# Patient Record
Sex: Female | Born: 1955 | Race: White | Hispanic: No | Smoking: Current some day smoker
Health system: Southern US, Community
[De-identification: ages and names within clinical notes are randomized; demographics above are authoritative.]

## PROBLEM LIST (undated history)

## (undated) DIAGNOSIS — G43909 Migraine, unspecified, not intractable, without status migrainosus: Secondary | ICD-10-CM

## (undated) DIAGNOSIS — Z9889 Other specified postprocedural states: Secondary | ICD-10-CM

## (undated) DIAGNOSIS — R112 Nausea with vomiting, unspecified: Secondary | ICD-10-CM

## (undated) DIAGNOSIS — G479 Sleep disorder, unspecified: Secondary | ICD-10-CM

## (undated) DIAGNOSIS — K589 Irritable bowel syndrome without diarrhea: Secondary | ICD-10-CM

## (undated) DIAGNOSIS — A044 Other intestinal Escherichia coli infections: Secondary | ICD-10-CM

## (undated) DIAGNOSIS — M1990 Unspecified osteoarthritis, unspecified site: Secondary | ICD-10-CM

## (undated) DIAGNOSIS — K635 Polyp of colon: Secondary | ICD-10-CM

## (undated) DIAGNOSIS — A0811 Acute gastroenteropathy due to Norwalk agent: Secondary | ICD-10-CM

## (undated) HISTORY — DX: Sleep disorder, unspecified: G47.9

## (undated) HISTORY — DX: Irritable bowel syndrome, unspecified: K58.9

## (undated) HISTORY — PX: PARS PLANA VITRECTOMY W/ REPAIR OF MACULAR HOLE: SHX2170

## (undated) HISTORY — DX: Other intestinal Escherichia coli infections: A04.4

## (undated) HISTORY — DX: Acute gastroenteropathy due to Norwalk agent: A08.11

## (undated) HISTORY — DX: Migraine, unspecified, not intractable, without status migrainosus: G43.909

## (undated) HISTORY — PX: SHOULDER SURGERY: SHX246

## (undated) HISTORY — DX: Unspecified osteoarthritis, unspecified site: M19.90

## (undated) HISTORY — DX: Polyp of colon: K63.5

---

## 1995-04-11 HISTORY — PX: CHOLECYSTECTOMY: SHX55

## 2001-01-24 ENCOUNTER — Other Ambulatory Visit: Admission: RE | Admit: 2001-01-24 | Discharge: 2001-01-24 | Payer: Self-pay | Admitting: Gynecology

## 2002-08-15 ENCOUNTER — Encounter (HOSPITAL_COMMUNITY): Admission: RE | Admit: 2002-08-15 | Discharge: 2002-09-14 | Payer: Self-pay | Admitting: Orthopedic Surgery

## 2002-09-17 ENCOUNTER — Encounter (HOSPITAL_COMMUNITY): Admission: RE | Admit: 2002-09-17 | Discharge: 2002-10-17 | Payer: Self-pay | Admitting: Orthopedic Surgery

## 2002-10-02 ENCOUNTER — Other Ambulatory Visit: Admission: RE | Admit: 2002-10-02 | Discharge: 2002-10-02 | Payer: Self-pay | Admitting: Gynecology

## 2002-10-20 ENCOUNTER — Encounter (HOSPITAL_COMMUNITY): Admission: RE | Admit: 2002-10-20 | Discharge: 2002-11-19 | Payer: Self-pay | Admitting: Orthopedic Surgery

## 2002-11-27 ENCOUNTER — Encounter (HOSPITAL_COMMUNITY): Admission: RE | Admit: 2002-11-27 | Discharge: 2002-12-27 | Payer: Self-pay | Admitting: Orthopedic Surgery

## 2003-07-14 ENCOUNTER — Other Ambulatory Visit: Admission: RE | Admit: 2003-07-14 | Discharge: 2003-07-14 | Payer: Self-pay | Admitting: Gynecology

## 2005-11-21 ENCOUNTER — Ambulatory Visit (HOSPITAL_COMMUNITY): Admission: RE | Admit: 2005-11-21 | Discharge: 2005-11-21 | Payer: Self-pay | Admitting: Internal Medicine

## 2005-11-21 ENCOUNTER — Ambulatory Visit: Payer: Self-pay | Admitting: Internal Medicine

## 2006-03-26 ENCOUNTER — Ambulatory Visit (HOSPITAL_COMMUNITY): Admission: RE | Admit: 2006-03-26 | Discharge: 2006-03-26 | Payer: Self-pay | Admitting: Internal Medicine

## 2012-08-29 ENCOUNTER — Encounter: Payer: Self-pay | Admitting: Gynecology

## 2012-08-30 ENCOUNTER — Ambulatory Visit (INDEPENDENT_AMBULATORY_CARE_PROVIDER_SITE_OTHER): Payer: BC Managed Care – PPO | Admitting: Women's Health

## 2012-08-30 ENCOUNTER — Other Ambulatory Visit (HOSPITAL_COMMUNITY)
Admission: RE | Admit: 2012-08-30 | Discharge: 2012-08-30 | Disposition: A | Discharge status: Home / Self Care | Payer: BC Managed Care – PPO | Source: Ambulatory Visit | Attending: Women's Health | Admitting: Women's Health

## 2012-08-30 ENCOUNTER — Encounter: Payer: Self-pay | Admitting: Gynecology

## 2012-08-30 ENCOUNTER — Encounter: Payer: Self-pay | Admitting: Women's Health

## 2012-08-30 VITALS — BP 122/76 | Ht 61.0 in | Wt 133.0 lb

## 2012-08-30 DIAGNOSIS — Z833 Family history of diabetes mellitus: Secondary | ICD-10-CM

## 2012-08-30 DIAGNOSIS — Z13220 Encounter for screening for lipoid disorders: Secondary | ICD-10-CM

## 2012-08-30 DIAGNOSIS — Z01419 Encounter for gynecological examination (general) (routine) without abnormal findings: Secondary | ICD-10-CM | POA: Insufficient documentation

## 2012-08-30 DIAGNOSIS — E079 Disorder of thyroid, unspecified: Secondary | ICD-10-CM

## 2012-08-30 LAB — URINALYSIS W MICROSCOPIC + REFLEX CULTURE
Bilirubin Urine: NEGATIVE
Hgb urine dipstick: NEGATIVE
Leukocytes, UA: NEGATIVE
Nitrite: NEGATIVE
Protein, ur: NEGATIVE mg/dL
Specific Gravity, Urine: 1.007 (ref 1.005–1.030)
Squamous Epithelial / LPF: NONE SEEN
Urobilinogen, UA: 0.2 mg/dL (ref 0.0–1.0)
pH: 7 (ref 5.0–8.0)

## 2012-08-30 LAB — LIPID PANEL
LDL Cholesterol: 90 mg/dL (ref 0–99)
Triglycerides: 71 mg/dL (ref <150)

## 2012-08-30 LAB — CBC WITH DIFFERENTIAL/PLATELET
Basophils Absolute: 0 10*3/uL (ref 0.0–0.1)
Hemoglobin: 14.1 g/dL (ref 12.0–15.0)
Lymphocytes Relative: 40 % (ref 12–46)
Lymphs Abs: 3 10*3/uL (ref 0.7–4.0)
MCHC: 35.1 g/dL (ref 30.0–36.0)
Neutro Abs: 3.7 10*3/uL (ref 1.7–7.7)
Neutrophils Relative %: 51 % (ref 43–77)
RBC: 4.66 MIL/uL (ref 3.87–5.11)
WBC: 7.4 10*3/uL (ref 4.0–10.5)

## 2012-08-30 LAB — GLUCOSE, RANDOM: Glucose, Bld: 89 mg/dL (ref 70–99)

## 2012-08-30 NOTE — Addendum Note (Signed)
Addended by: Dayna Barker on: 08/30/2012 11:02 AM   Modules accepted: Orders

## 2012-08-30 NOTE — Patient Instructions (Addendum)
Vit D 2000 daily/fish oil supplement daily/vit E daily Health Recommendations for Postmenopausal Women Respected and ongoing research has looked at the most common causes of death, disability, and poor quality of life in postmenopausal women. The causes include heart disease, diseases of blood vessels, diabetes, depression, cancer, and bone loss (osteoporosis). Many things can be done to help lower the chances of developing these and other common problems: CARDIOVASCULAR DISEASE Heart Disease: A heart attack is a medical emergency. Know the signs and symptoms of a heart attack. Below are things women can do to reduce their risk for heart disease.   Do not smoke. If you smoke, quit.  Aim for a healthy weight. Being overweight causes many preventable deaths. Eat a healthy and balanced diet and drink an adequate amount of liquids.  Get moving. Make a commitment to be more physically active. Aim for 30 minutes of activity on most, if not all days of the week.  Eat for heart health. Choose a diet that is low in saturated fat and cholesterol and eliminate trans fat. Include whole grains, vegetables, and fruits. Read and understand the labels on food containers before buying.  Know your numbers. Ask your caregiver to check your blood pressure, cholesterol (total, HDL, LDL, triglycerides) and blood glucose. Work with your caregiver on improving your entire clinical picture.  High blood pressure. Limit or stop your table salt intake (try salt substitute and food seasonings). Avoid salty foods and drinks. Read labels on food containers before buying. Eating well and exercising can help control high blood pressure. STROKE  Stroke is a medical emergency. Stroke may be the result of a blood clot in a blood vessel in the brain or by a brain hemorrhage (bleeding). Know the signs and symptoms of a stroke. To lower the risk of developing a stroke:  Avoid fatty foods.  Quit smoking.  Control your diabetes,  blood pressure, and irregular heart rate. THROMBOPHLEBITIS (BLOOD CLOT) OF THE LEG  Becoming overweight and leading a stationary lifestyle may also contribute to developing blood clots. Controlling your diet and exercising will help lower the risk of developing blood clots. CANCER SCREENING  Breast Cancer: Take steps to reduce your risk of breast cancer.  You should practice "breast self-awareness." This means understanding the normal appearance and feel of your breasts and should include breast self-examination. Any changes detected, no matter how small, should be reported to your caregiver.  After age 32, you should have a clinical breast exam (CBE) every year.  Starting at age 42, you should consider having a mammogram (breast X-ray) every year.  If you have a family history of breast cancer, talk to your caregiver about genetic screening.  If you are at high risk for breast cancer, talk to your caregiver about having an MRI and a mammogram every year.  Intestinal or Stomach Cancer: Tests to consider are a rectal exam, fecal occult blood, sigmoidoscopy, and colonoscopy. Women who are high risk may need to be screened at an earlier age and more often.  Cervical Cancer:  Beginning at age 55, you should have a Pap test every 3 years as long as the past 3 Pap tests have been normal.  If you have had past treatment for cervical cancer or a condition that could lead to cancer, you need Pap tests and screening for cancer for at least 20 years after your treatment.  If you had a hysterectomy for a problem that was not cancer or a condition that could lead to  cancer, then you no longer need Pap tests.  If you are between ages 46 and 10, and you have had normal Pap tests going back 10 years, you no longer need Pap tests.  If Pap tests have been discontinued, risk factors (such as a new sexual partner) need to be reassessed to determine if screening should be resumed.  Some medical problems  can increase the chance of getting cervical cancer. In these cases, your caregiver may recommend more frequent screening and Pap tests.  Uterine Cancer: If you have vaginal bleeding after reaching menopause, you should notify your caregiver.  Ovarian cancer: Other than yearly pelvic exams, there are no reliable tests available to screen for ovarian cancer at this time except for yearly pelvic exams.  Lung Cancer: Yearly chest X-rays can detect lung cancer and should be done on high risk women, such as cigarette smokers and women with chronic lung disease (emphysema).  Skin Cancer: A complete body skin exam should be done at your yearly examination. Avoid overexposure to the sun and ultraviolet light lamps. Use a strong sun block cream when in the sun. All of these things are important in lowering the risk of skin cancer. MENOPAUSE Menopause Symptoms: Hormone therapy products are effective for treating symptoms associated with menopause:  Moderate to severe hot flashes.  Night sweats.  Mood swings.  Headaches.  Tiredness.  Loss of sex drive.  Insomnia.  Other symptoms. Hormone replacement carries certain risks, especially in older women. Women who use or are thinking about using estrogen or estrogen with progestin treatments should discuss that with their caregiver. Your caregiver will help you understand the benefits and risks. The ideal dose of hormone replacement therapy is not known. The Food and Drug Administration (FDA) has concluded that hormone therapy should be used only at the lowest doses and for the shortest amount of time to reach treatment goals.  OSTEOPOROSIS Protecting Against Bone Loss and Preventing Fracture: If you use hormone therapy for prevention of bone loss (osteoporosis), the risks for bone loss must outweigh the risk of the therapy. Ask your caregiver about other medications known to be safe and effective for preventing bone loss and fractures. To guard against  bone loss or fractures, the following is recommended:  If you are less than age 40, take 1000 mg of calcium and at least 600 mg of Vitamin D per day.  If you are greater than age 44 but less than age 14, take 1200 mg of calcium and at least 600 mg of Vitamin D per day.  If you are greater than age 41, take 1200 mg of calcium and at least 800 mg of Vitamin D per day. Smoking and excessive alcohol intake increases the risk of osteoporosis. Eat foods rich in calcium and vitamin D and do weight bearing exercises several times a week as your caregiver suggests. DIABETES Diabetes Melitus: If you have Type I or Type 2 diabetes, you should keep your blood sugar under control with diet, exercise and recommended medication. Avoid too many sweets, starchy and fatty foods. Being overweight can make control more difficult. COGNITION AND MEMORY Cognition and Memory: Menopausal hormone therapy is not recommended for the prevention of cognitive disorders such as Alzheimer's disease or memory loss.  DEPRESSION  Depression may occur at any age, but is common in elderly women. The reasons may be because of physical, medical, social (loneliness), or financial problems and needs. If you are experiencing depression because of medical problems and control of symptoms,  talk to your caregiver about this. Physical activity and exercise may help with mood and sleep. Community and volunteer involvement may help your sense of value and worth. If you have depression and you feel that the problem is getting worse or becoming severe, talk to your caregiver about treatment options that are best for you. ACCIDENTS  Accidents are common and can be serious in the elderly woman. Prepare your house to prevent accidents. Eliminate throw rugs, place hand bars in the bath, shower and toilet areas. Avoid wearing high heeled shoes or walking on wet, snowy, and icy areas. Limit or stop driving if you have vision or hearing problems, or you feel  you are unsteady with you movements and reflexes. HEPATITIS C Hepatitis C is a type of viral infection affecting the liver. It is spread mainly through contact with blood from an infected person. It can be treated, but if left untreated, it can lead to severe liver damage over years. Many people who are infected do not know that the virus is in their blood. If you are a "baby-boomer", it is recommended that you have one screening test for Hepatitis C. IMMUNIZATIONS  Several immunizations are important to consider having during your senior years, including:   Tetanus, diptheria, and pertussis booster shot.  Influenza every year before the flu season begins.  Pneumonia vaccine.  Shingles vaccine.  Others as indicated based on your specific needs. Talk to your caregiver about these. Document Released: 05/19/2005 Document Revised: 03/13/2012 Document Reviewed: 01/13/2008 [facility] Patient Information 2014 [place], [place].

## 2012-08-30 NOTE — Progress Notes (Signed)
Paula Trevino 07-05-1955 [id number]    History:    The patient presents for annual exam.  Postmenopausal with no bleeding on no HRT. Last Pap 2005, history of normal Paps. Has had annual mammograms that have been normal. Reports normal blood work at screenings at work. Not sexually active. History of IBS and has had colonoscopies with benign polyps, 2014 colonoscopy no polyps.   Past medical history, past surgical history, family history and social history were all reviewed and documented in the EPIC chart. Planning to retire, special ed Runner, broadcasting/film/video. Widow, husband died from a ruptured aneurysm. Carly 22 graduated from [employer]. , having problems with endometriosis.mother hypertension, recent hip fracture,  in rehabilitation.   ROS:  A  ROS was performed and pertinent positives and negatives are included in the history.  Exam:  Filed Vitals:   08/30/12 1010  BP: 122/76    General appearance:  Normal Head/Neck:  Normal, without cervical or supraclavicular adenopathy. Thyroid:  Symmetrical, normal in size, without palpable masses or nodularity. Respiratory  Effort:  Normal  Auscultation:  Clear without wheezing or rhonchi Cardiovascular  Auscultation:  Regular rate, without rubs, murmurs or gallops  Edema/varicosities:  Not grossly evident Abdominal  Soft,nontender, without masses, guarding or rebound.  Liver/spleen:  No organomegaly noted  Hernia:  None appreciated  Skin  Inspection:  Grossly normal  Palpation:  Grossly normal Neurologic/psychiatric  Orientation:  Normal with appropriate conversation.  Mood/affect:  Normal  Genitourinary    Breasts: Examined lying and sitting.     Right: Without masses, retractions, discharge or axillary adenopathy.     Left: Without masses, retractions, discharge or axillary adenopathy.   Inguinal/mons:  Normal without inguinal adenopathy  External genitalia:  Normal  BUS/Urethra/Skene's glands:  Normal  Bladder:  Normal  Vagina:   atrophic  Cervix:  Normal  Uterus:  normal in size, shape and contour.  Midline and mobile  Adnexa/parametria:     Rt: Without masses or tenderness.   Lt: Without masses or tenderness.  Anus and perineum: Normal  Digital rectal exam: Normal sphincter tone without palpated masses or tenderness  Assessment/Plan:  57 y.o.WWF G1 P1  for annual exam with no complaints.  Overdue annual exam, normal postmenopausal Vaginal atrophy/not sexually active IBS negative colonoscopy 2014  Plan: DEXA instructed to schedule, home safety and fall prevention discussed, calcium rich foods, vitamin D 2000 daily encouraged. CBC, glucose, lipid panel, TSH, UA, Pap. Last normal Pap 2005. SBE's, continue annual mammogram, calcium rich diet, continue regular exercise up walking. Condoms and vaginal lubricants encouraged if becomes sexually active.   Harrington Challenger [facility], 10:45 AM 08/30/2012

## 2012-09-09 ENCOUNTER — Encounter: Payer: Self-pay | Admitting: Women's Health

## 2012-09-17 ENCOUNTER — Other Ambulatory Visit: Payer: Self-pay | Admitting: Gynecology

## 2012-09-17 DIAGNOSIS — M810 Age-related osteoporosis without current pathological fracture: Secondary | ICD-10-CM

## 2012-09-17 DIAGNOSIS — Z01419 Encounter for gynecological examination (general) (routine) without abnormal findings: Secondary | ICD-10-CM

## 2012-11-02 ENCOUNTER — Emergency Department (HOSPITAL_COMMUNITY): Payer: BC Managed Care – PPO

## 2012-11-02 ENCOUNTER — Emergency Department (HOSPITAL_COMMUNITY)
Admission: EM | Admit: 2012-11-02 | Discharge: 2012-11-02 | Disposition: A | Discharge status: Home / Self Care | Payer: BC Managed Care – PPO | Attending: Emergency Medicine | Admitting: Emergency Medicine

## 2012-11-02 ENCOUNTER — Encounter (HOSPITAL_COMMUNITY): Payer: Self-pay | Admitting: *Deleted

## 2012-11-02 DIAGNOSIS — F17200 Nicotine dependence, unspecified, uncomplicated: Secondary | ICD-10-CM | POA: Insufficient documentation

## 2012-11-02 DIAGNOSIS — Z860100 Personal history of colon polyps, unspecified: Secondary | ICD-10-CM | POA: Insufficient documentation

## 2012-11-02 DIAGNOSIS — R300 Dysuria: Secondary | ICD-10-CM | POA: Insufficient documentation

## 2012-11-02 DIAGNOSIS — G43909 Migraine, unspecified, not intractable, without status migrainosus: Secondary | ICD-10-CM

## 2012-11-02 DIAGNOSIS — Z8719 Personal history of other diseases of the digestive system: Secondary | ICD-10-CM | POA: Insufficient documentation

## 2012-11-02 DIAGNOSIS — R110 Nausea: Secondary | ICD-10-CM | POA: Insufficient documentation

## 2012-11-02 MED ORDER — KETOROLAC TROMETHAMINE 30 MG/ML IJ SOLN
30.0000 mg | Freq: Once | INTRAMUSCULAR | Status: AC
  Administered 2012-11-02: 30 mg via INTRAVENOUS
  Filled 2012-11-02: qty 1

## 2012-11-02 MED ORDER — SODIUM CHLORIDE 0.9 % IV SOLN
Freq: Once | INTRAVENOUS | Status: AC
  Administered 2012-11-02: 11:00:00 via INTRAVENOUS

## 2012-11-02 MED ORDER — DEXAMETHASONE SODIUM PHOSPHATE 4 MG/ML IJ SOLN
10.0000 mg | Freq: Once | INTRAMUSCULAR | Status: AC
  Administered 2012-11-02: 10 mg via INTRAVENOUS
  Filled 2012-11-02: qty 3

## 2012-11-02 MED ORDER — KETOROLAC TROMETHAMINE 10 MG PO TABS: 10.0000 mg | ORAL_TABLET | Freq: Four times a day (QID) | ORAL | Status: DC | PRN

## 2012-11-02 MED ORDER — DIPHENHYDRAMINE HCL 50 MG/ML IJ SOLN
12.5000 mg | Freq: Once | INTRAMUSCULAR | Status: DC
  Filled 2012-11-02: qty 1

## 2012-11-02 NOTE — ED Provider Notes (Signed)
CSN: [id number]     Arrival date & time 11/02/12  4098 History  This chart was scribed for Paula Cooper III, MD by Bennett Scrape, ED Scribe. This patient was seen in room APA18/APA18 and the patient's care was started at 10:32 AM.   First MD Initiated Contact with Patient 11/02/12 1017     Chief Complaint  Patient presents with  . Migraine    The history is provided by the patient. No language interpreter was used.   HPI Comments: Paula Trevino is a 57 y.o. female who presents to the Emergency Department complaining of 15 days right-sided HA with associated mild nausea. She reports that the HA started behind the right eye and has since radiated to the entire right head. She describes the pain as waxing and waning with a pulsating sensation felt at its worst and a tightness felt at its best. She has a family h/o migraines and reports being treated for HAs in her mid 33s and 30s with allergy injections. She states that she has experienced prior HAs lasting for 3 to 4 days but denies any episodes with a similar duration. She originally thought that the HA was from either a sinus infection or tension HA after coming back from the beach but became concerned when it continued past 10 days. She has tried staying hydrated and has been taking tylenol, ibuprofen and rubbing Vicks Vapor rub directly on her forehead with mild improvement. She denies emesis and denies seeing auras or flashing lights. She denies having a h/o HTN, DM or asthma. LNMP was 10 years ago.    Past Medical History  Diagnosis Date  . IBS (irritable bowel syndrome)   . Colon polyps    Past Surgical History  Procedure Laterality Date  . Cholecystectomy    . Cesarean section    . Shoulder surgery    confirmed by pt at bedside  Family History  Problem Relation Age of Onset  . Hypertension Mother   . Heart disease Father   . Stroke Maternal Grandmother   . Heart disease Paternal Grandmother   . Cancer Paternal Grandfather      Stomach   History  Substance Use Topics  . Smoking status: Current Some Day Smoker  . Smokeless tobacco: Not on file  . Alcohol Use: Yes     Comment: Rare   OB History   Grav Para Term Preterm Abortions TAB SAB Ect Mult Living   1 1 1       1      Review of Systems  Constitutional: Negative for fever and chills.  HENT: Negative for ear pain and sore throat.   Eyes: Negative for visual disturbance.  Respiratory: Negative for cough.   Cardiovascular: Negative for chest pain.  Gastrointestinal: Positive for nausea. Negative for vomiting.  Genitourinary: Positive for dysuria (x2 days, felt intermittently). Negative for frequency.  Skin: Negative for rash.  Neurological: Positive for headaches. Negative for seizures and syncope.  All other systems reviewed and are negative.    Allergies  Codeine (severe emesis reaction) and Sulfa antibiotics, ?PCN-confirmed by pt at bedside  Home Medications   Current Outpatient Rx  Name  Route  Sig  Dispense  Refill  . ALPRAZolam (XANAX) 0.25 MG tablet   Oral   Take 0.25 mg by mouth at bedtime as needed for sleep.         . Calcium Carbonate-Vitamin D (CALCIUM + D PO)   Oral   Take by mouth.  Triage Vitals: BP 119/79  Pulse 70  Temp(Src) 97.9 F (36.6 C) (Oral)  Resp 16  Ht 5\' 1"  (1.549 m)  Wt 137 lb (62.143 kg)  BMI 25.9 kg/m2  SpO2 100%  Physical Exam  Nursing note and vitals reviewed. Constitutional: She is oriented to person, place, and time. She appears well-developed and well-nourished. No distress.  HENT:  Head: Normocephalic and atraumatic.  Mouth/Throat: Oropharynx is clear and moist.  Localizes pain to right temple  Eyes: Conjunctivae and EOM are normal. Pupils are equal, round, and reactive to light.  Neck: Neck supple. No tracheal deviation present.  Cardiovascular: Normal rate and regular rhythm.   Pulmonary/Chest: Effort normal and breath sounds normal. No respiratory distress.   Musculoskeletal: Normal range of motion. She exhibits no edema.  Neurological: She is alert and oriented to person, place, and time.  Skin: Skin is warm and dry.  Psychiatric: She has a normal mood and affect. Her behavior is normal.    ED Course   Procedures (including critical care time)  Medications  0.9 %  sodium chloride infusion (not administered)  ketorolac (TORADOL) 30 MG/ML injection 30 mg (not administered)  dexamethasone (DECADRON) injection 10 mg (not administered)  diphenhydrAMINE (BENADRYL) injection 12.5 mg (not administered)    DIAGNOSTIC STUDIES: Oxygen Saturation is 100% on room air, normal by my interpretation.    COORDINATION OF CARE: 10:37 AM-Discussed treatment plan which includes medications, CT of head, CBC and CMP with pt and pt agreed. Pt declined Reglan. Will order Toradol instead.  11:52 AM-Pt rechecked and feels improved with medications listed above. Informed pt of negative CT of head. Pt is requesting that I rule out temporal aortitis.Will order sedimentation rate. Discussed discharge plan with pt and pt agreed to plan. Also advised pt to follow up as needed and pt agreed. Addressed symptoms to return for with pt.   Ct Head Wo Contrast  11/02/2012   *RADIOLOGY REPORT*  Clinical Data: Right-sided head and neck pain.  CT HEAD WITHOUT CONTRAST  Technique:  Contiguous axial images were obtained from the base of the skull through the vertex without contrast.  Comparison: No priors.  Findings: No acute intracranial abnormalities.  Specifically, no evidence of acute intracranial hemorrhage, no definite findings of acute/subacute cerebral ischemia, no mass, mass effect, hydrocephalus or abnormal intra or extra-axial fluid collections. A prominent sulcus adjacent to the cerebellar vermis on the left is incidentally noted (favored to be a normal variant).  Visualized paranasal sinuses and mastoids are well pneumatized.  No acute displaced skull fractures are  identified.  IMPRESSION: 1.  No acute intracranial abnormalities.   Original Report Authenticated By: Trudie Reed, M.D.     1. Migraine headache    I personally performed the services described in this documentation, which was scribed in my presence. The recorded information has been reviewed and is accurate.  Osvaldo Human, MD    Paula Cooper III, MD 11/02/12 1200

## 2012-11-02 NOTE — ED Notes (Signed)
Pt c/o headache with nausea for 15 days, was sent to er for further evaluation by pcp.

## 2012-11-03 NOTE — Progress Notes (Signed)
November 03, 2012 7:03 AM Review of lab results shows that pt's sedimentation rate is 5, showing that she does not have temporal arteritis.

## 2012-11-26 ENCOUNTER — Ambulatory Visit (INDEPENDENT_AMBULATORY_CARE_PROVIDER_SITE_OTHER): Payer: BC Managed Care – PPO

## 2012-11-26 DIAGNOSIS — M8580 Other specified disorders of bone density and structure, unspecified site: Secondary | ICD-10-CM

## 2012-11-26 DIAGNOSIS — Z01419 Encounter for gynecological examination (general) (routine) without abnormal findings: Secondary | ICD-10-CM

## 2012-11-26 DIAGNOSIS — M810 Age-related osteoporosis without current pathological fracture: Secondary | ICD-10-CM

## 2012-11-26 DIAGNOSIS — M899 Disorder of bone, unspecified: Secondary | ICD-10-CM

## 2013-01-07 ENCOUNTER — Ambulatory Visit (INDEPENDENT_AMBULATORY_CARE_PROVIDER_SITE_OTHER): Payer: BC Managed Care – PPO | Admitting: Diagnostic Neuroimaging

## 2013-01-07 ENCOUNTER — Encounter: Payer: Self-pay | Admitting: Diagnostic Neuroimaging

## 2013-01-07 VITALS — BP 102/69 | HR 88 | Temp 98.0°F | Ht 61.0 in | Wt 133.0 lb

## 2013-01-07 DIAGNOSIS — G43009 Migraine without aura, not intractable, without status migrainosus: Secondary | ICD-10-CM

## 2013-01-07 MED ORDER — TOPIRAMATE 25 MG PO TABS: 25.0000 mg | ORAL_TABLET | Freq: Two times a day (BID) | ORAL | Status: DC

## 2013-01-07 MED ORDER — SUMATRIPTAN SUCCINATE 50 MG PO TABS: 50.0000 mg | ORAL_TABLET | ORAL | Status: DC | PRN

## 2013-01-07 NOTE — Patient Instructions (Addendum)
Try topiramate 25mg  at bedtime for migraine prevention. Increase to goal of 50mg  twice a day gradually over next 2-4 weeks. Reduce if you have side effects. Drink plenty of water with topiramate.  Take sumatriptan as needed for breakthrough migraine.

## 2013-01-07 NOTE — Progress Notes (Addendum)
[facility]  PATIENT: Paula Trevino DOB: 09/08/1955  REFERRING CLINICIAN: Eksir HISTORY FROM: patient REASON FOR VISIT: new consult   HISTORICAL  CHIEF COMPLAINT:  Chief Complaint  Patient presents with  . Headache    HISTORY OF PRESENT ILLNESS:   57 year old right-handed female here for a evaluation of headaches, recurrent since 2012, but worsened since July 2014.  Patient was in her 65s, she had developed right-sided headaches with photophobia, pulsating and throbbing sensation. She took over-the-counter medications with good relief. However, she was not officially diagnosed with migraine.  By late 30s and 40s, she had stopped having headaches.  2012 headaches returned. Since summer 2014, headaches have been significantly worsen. She had recurrent right-sided throbbing severe headaches with retro-orbital pain, nausea, photophobia. She was sensitive to smells. She's been taking Excedrin Migraine, Benadryl, Motrin, Tylenol as a for headaches. Patient went to the emergency room one time because of severe headaches, received headache cocktails and CT scan head which are unremarkable. Since then she's had lingering daily headaches. She has 2-3 severe headaches per week.  Strong family history of migraine headaches in patient's sister, daughter, internal great aunt. Patient is never tried antimigraine medications in the past. No specific triggering factors.   REVIEW OF SYSTEMS: Full 14 system review of systems performed and notable only for fatigue eye pain shortness of breath palpitation headache dizziness allergy.  ALLERGIES: Allergies  Allergen Reactions  . Codeine Nausea And Vomiting  . Sulfa Antibiotics Rash    HOME MEDICATIONS: Outpatient Prescriptions Prior to Visit  Medication Sig Dispense Refill  . ALPRAZolam (XANAX) 0.25 MG tablet Take 0.25 mg by mouth at bedtime as needed for sleep.      . Calcium Carbonate-Vitamin D (CALCIUM + D PO) Take by  mouth.      Marland Kitchen ketorolac (TORADOL) 10 MG tablet Take 1 tablet (10 mg total) by mouth every 6 (six) hours as needed for pain.  20 tablet  0   No facility-administered medications prior to visit.    PAST MEDICAL HISTORY: Past Medical History  Diagnosis Date  . IBS (irritable bowel syndrome)   . Colon polyps   . Arthritis     PAST SURGICAL HISTORY: Past Surgical History  Procedure Laterality Date  . Cholecystectomy  1997  . Cesarean section  1991  . Shoulder surgery      FAMILY HISTORY: Family History  Problem Relation Age of Onset  . Hypertension Mother   . Heart disease Father   . Stroke Maternal Grandmother   . Heart disease Paternal Grandmother   . Cancer Paternal Grandfather     Stomach  . Migraines Sister   . Migraines Maternal Aunt     great aunt  . Migraines Daughter     SOCIAL HISTORY:  History   Social History  . Marital Status: Widowed    Spouse Name: N/A    Number of Children: 1  . Years of Education: BS   Occupational History  . Retired     Runner, broadcasting/film/video   Social History Main Topics  . Smoking status: Current Some Day Smoker -- 0.50 packs/day    Types: Cigarettes  . Smokeless tobacco: Never Used  . Alcohol Use: Yes     Comment: Rare; 1 glass of wine  . Drug Use: No  . Sexual Activity: No   Other Topics Concern  . Not on file   Social History Narrative   Patient lives at home alone.   Caffeine Use: 1 soda and  2 cups of coffee daily     PHYSICAL EXAM  Filed Vitals:   01/07/13 1024  BP: 102/69  Pulse: 88  Temp: 98 F (36.7 C)  TempSrc: Oral  Height: 5\' 1"  (1.549 m)  Weight: 133 lb (60.328 kg)    Not recorded    Body mass index is 25.14 kg/(m^2).  GENERAL EXAM: Patient is in no distress  CARDIOVASCULAR: Regular rate and rhythm, no murmurs, no carotid bruits  NEUROLOGIC: MENTAL STATUS: awake, alert, language fluent, comprehension intact, naming intact CRANIAL NERVE: no papilledema on fundoscopic exam, pupils equal and  reactive to light, visual fields full to confrontation, extraocular muscles intact, no nystagmus, facial sensation and strength symmetric, uvula midline, shoulder shrug symmetric, tongue midline. MOTOR: normal bulk and tone, full strength in the BUE, BLE SENSORY: normal and symmetric to light touch, pinprick, temperature, vibration COORDINATION: finger-nose-finger, fine finger movements normal REFLEXES: deep tendon reflexes present and symmetric GAIT/STATION: narrow based gait; able to walk on toes, heels and tandem; romberg is negative   DIAGNOSTIC DATA (LABS, IMAGING, TESTING) - I reviewed patient records, labs, notes, testing and imaging myself where available.  Lab Results  Component Value Date   WBC 7.4 08/30/2012   HGB 14.1 08/30/2012   HCT 40.2 08/30/2012   MCV 86.3 08/30/2012   PLT 237 08/30/2012      Component Value Date/Time   GLUCOSE 89 08/30/2012 1045   Lab Results  Component Value Date   CHOL 157 08/30/2012   HDL 53 08/30/2012   LDLCALC 90 08/30/2012   TRIG 71 08/30/2012   CHOLHDL 3.0 08/30/2012   No results found for this basename: HGBA1C   No results found for this basename: VITAMINB12   Lab Results  Component Value Date   TSH 1.038 08/30/2012   Lab Results  Component Value Date   ESRSEDRATE 5 11/02/2012    I reviewed images myself and agree with interpretation.  11/02/12 CT HEAD - normal   ASSESSMENT AND PLAN  57 y.o. year old female here with long standing HA, now with recurrent headaches, suspicious for migraine. Neuro exam and CT head normal. ESR 5.   Dx: recurrent migraine without aura  PLAN: - start TPX + sumatriptan - headache education and strategies reviewed  Return in about 2 months (around 03/09/2013) for with Heide Guile or Penumalli.    Suanne Marker, MD 01/07/2013, 11:51 AM Certified in Neurology, Neurophysiology and Neuroimaging  [facility] [address] [place], [place] [address] [phone]

## 2013-03-10 ENCOUNTER — Encounter (INDEPENDENT_AMBULATORY_CARE_PROVIDER_SITE_OTHER): Payer: Self-pay

## 2013-03-10 ENCOUNTER — Encounter: Payer: Self-pay | Admitting: Nurse Practitioner

## 2013-03-10 ENCOUNTER — Ambulatory Visit (INDEPENDENT_AMBULATORY_CARE_PROVIDER_SITE_OTHER): Payer: BC Managed Care – PPO | Admitting: Nurse Practitioner

## 2013-03-10 VITALS — BP 113/70 | HR 72 | Temp 97.0°F | Ht 61.0 in | Wt 131.0 lb

## 2013-03-10 DIAGNOSIS — G43709 Chronic migraine without aura, not intractable, without status migrainosus: Secondary | ICD-10-CM | POA: Insufficient documentation

## 2013-03-10 DIAGNOSIS — G43009 Migraine without aura, not intractable, without status migrainosus: Secondary | ICD-10-CM

## 2013-03-10 MED ORDER — TOPIRAMATE 25 MG PO TABS: 25.0000 mg | ORAL_TABLET | Freq: Two times a day (BID) | ORAL | Status: DC

## 2013-03-10 NOTE — Patient Instructions (Addendum)
PLAN:  Continue Topamax + sumatriptan  headache education and strategies reviewed  Return in about 6 months with NP or Dr. Marjory Lies, sooner as needed.

## 2013-03-10 NOTE — Progress Notes (Signed)
PATIENT: Paula Trevino DOB: [birth year]   REASON FOR VISIT: follow up HISTORY FROM: patient  HISTORY OF PRESENT ILLNESS: 01/07/13 (VP):  57 year old right-handed female here for a evaluation of headaches, recurrent since 2012, but worsened since July 2014.  Patient was in her 66s, she had developed right-sided headaches with photophobia, pulsating and throbbing sensation. She took over-the-counter medications with good relief. However, she was not officially diagnosed with migraine.  By late 30s and 40s, she had stopped having headaches.  2012 headaches returned. Since summer 2014, headaches have been significantly worsen. She had recurrent right-sided throbbing severe headaches with retro-orbital pain, nausea, photophobia. She was sensitive to smells. She's been taking Excedrin Migraine, Benadryl, Motrin, Tylenol as a for headaches. Patient went to the emergency room one time because of severe headaches, received headache cocktails and CT scan head which are unremarkable. Since then she's had lingering daily headaches. She has 2-3 severe headaches per week.  Strong family history of migraine headaches in patient's sister, daughter, internal great aunt. Patient is never tried antimigraine medications in the past. No specific triggering factors.   03/10/13 (LL):  Patient returns for 2 month follow up for Migraines, they have greatly improved on TPX.  She is having some tingling in fingers and occasional  facial numbness, balance seems off, nausea first few weeks (resolved).  She is happy not to have severe headache though.  Still having 1-2 milder headaches, treating with ibuprofen.  She has not had to use Sumatriptan.  She is happy with current treatment.  REVIEW OF SYSTEMS: Full 14 system review of systems performed and notable only for slurred speech.  ALLERGIES: Allergies  Allergen Reactions  . Codeine Nausea And Vomiting  . Sulfa Antibiotics Rash    HOME MEDICATIONS: Outpatient  Prescriptions Prior to Visit  Medication Sig Dispense Refill  . Acetaminophen (TYLENOL PO) Take by mouth as needed.      . ALPRAZolam (XANAX) 0.25 MG tablet Take 0.25 mg by mouth at bedtime as needed for sleep.      Marland Kitchen aspirin-acetaminophen-caffeine (EXCEDRIN MIGRAINE) 250-250-65 MG per tablet Take 1 tablet by mouth every 6 (six) hours as needed for pain.      . Calcium Carbonate-Vitamin D (CALCIUM + D PO) Take by mouth.      . diphenhydrAMINE (BENADRYL) 25 MG tablet Take 25 mg by mouth every 6 (six) hours as needed for itching.      . Ibuprofen (MOTRIN PO) Take by mouth as needed.      . SUMAtriptan (IMITREX) 50 MG tablet Take 1 tablet (50 mg total) by mouth as needed for migraine. May repeat in 2 hours if headache persists or recurs. Maximum 2 tabs in 24 hours.  10 tablet  6  . topiramate (TOPAMAX) 25 MG tablet Take 1 tablet (25 mg total) by mouth 2 (two) times daily.  120 tablet  6  . ketorolac (TORADOL) 10 MG tablet Take 1 tablet (10 mg total) by mouth every 6 (six) hours as needed for pain.  20 tablet  0  . prednisoLONE acetate (PRED FORTE) 1 % ophthalmic suspension Place 3 drops into both eyes daily.       No facility-administered medications prior to visit.    PAST MEDICAL HISTORY: Past Medical History  Diagnosis Date  . IBS (irritable bowel syndrome)   . Colon polyps   . Arthritis     PAST SURGICAL HISTORY: Past Surgical History  Procedure Laterality Date  . Cholecystectomy  1997  .  Cesarean section  1991  . Shoulder surgery      FAMILY HISTORY: Family History  Problem Relation Age of Onset  . Hypertension Mother   . Heart disease Father   . Stroke Maternal Grandmother   . Heart disease Paternal Grandmother   . Cancer Paternal Grandfather     Stomach  . Migraines Sister   . Migraines Maternal Aunt     great aunt  . Migraines Daughter     SOCIAL HISTORY: History   Social History  . Marital Status: Widowed    Spouse Name: N/A    Number of Children: 1  .  Years of Education: BS   Occupational History  . Retired     Runner, broadcasting/film/video   Social History Main Topics  . Smoking status: Current Some Day Smoker -- 0.50 packs/day    Types: Cigarettes  . Smokeless tobacco: Never Used  . Alcohol Use: Yes     Comment: Rare; 1 glass of wine  . Drug Use: No  . Sexual Activity: No   Other Topics Concern  . Not on file   Social History Narrative   Patient lives at home alone.   Caffeine Use: 1 soda and 2 cups of coffee daily     PHYSICAL EXAM  Filed Vitals:   03/10/13 0907  BP: 113/70  Pulse: 72  Temp: 97 F (36.1 C)  TempSrc: Oral  Height: 5\' 1"  (1.549 m)  Weight: 131 lb (59.421 kg)   Body mass index is 24.76 kg/(m^2).  Generalized: Well developed, in no acute distress  Head: normocephalic and atraumatic. Oropharynx benign  Neck: Supple, no carotid bruits  Cardiac: Regular rate rhythm, no murmur  Musculoskeletal: No deformity   Neurological examination   Mentation: Alert oriented to time, place, history taking. Follows all commands speech and language fluent Cranial nerve II-XII: Fundoscopic exam reveals sharp disc margins.Pupils were equal round reactive to light extraocular movements were full, visual field were full on confrontational test. Facial sensation and strength were normal. hearing was intact to finger rubbing bilaterally. Uvula tongue midline. head turning and shoulder shrug and were normal and symmetric.Tongue protrusion into cheek strength was normal. Motor: normal bulk and tone, full strength in the BUE, BLE, fine finger movements normal, no pronator drift. No focal weakness Sensory: normal and symmetric to light touch, pinprick, and  vibration  Coordination: finger-nose-finger, heel-to-shin bilaterally, no dysmetria Reflexes:  Deep tendon reflexes in the upper and lower extremities are present and symmetric.  Gait and Station: Rising up from seated position without assistance, normal stance, without trunk ataxia, moderate  stride, good arm swing, smooth turning, able to perform tiptoe, and heel walking without difficulty.   DIAGNOSTIC DATA (LABS, IMAGING, TESTING) - I reviewed patient records, labs, notes, testing and imaging myself where available.  Lab Results  Component Value Date   WBC 7.4 08/30/2012   HGB 14.1 08/30/2012   HCT 40.2 08/30/2012   MCV 86.3 08/30/2012   PLT 237 08/30/2012      Component Value Date/Time   GLUCOSE 89 08/30/2012 1045   Lab Results  Component Value Date   CHOL 157 08/30/2012   HDL 53 08/30/2012   LDLCALC 90 08/30/2012   TRIG 71 08/30/2012   CHOLHDL 3.0 08/30/2012   No results found for this basename: HGBA1C   No results found for this basename: VITAMINB12   Lab Results  Component Value Date   TSH 1.038 08/30/2012   11/02/12 CT HEAD - normal   ASSESSMENT AND PLAN  57 y.o. year old female here with long standing HA, now with recurrent headaches, suspicious for migraine. Neuro exam and CT head normal. ESR 5.   Migraines improved on TPX, has some side effects (occasional facial numbness, off-balance) but agrees to stick with it for now.  Dx: recurrent migraine without aura   PLAN:  Continue TPX + sumatriptan  headache education and strategies reviewed  Return in about 6 months with NP or Dr. Marjory Lies  Meds ordered this encounter  Medications  . topiramate (TOPAMAX) 25 MG tablet    Sig: Take 1 tablet (25 mg total) by mouth 2 (two) times daily.    Dispense:  60 tablet    Refill:  6    Order Specific Question:  Supervising Provider    Answer:  Joycelyn Schmid R [[id number]]    Ronal Fear, MSN, NP-C 03/10/2013, 9:36 AM [facility] [address], [address] [place], [place] [address] [phone]  Note: This document was prepared with digital dictation and possible smart phrase technology. Any transcriptional errors that result from this process are unintentional.

## 2013-03-20 NOTE — Progress Notes (Signed)
I reviewed note and agree with plan.   VIKRAM R. PENUMALLI, MD  Certified in Neurology, Neurophysiology and Neuroimaging  [facility] [address], [address] [place], [place] [address] [phone]   

## 2013-09-29 ENCOUNTER — Encounter: Payer: Self-pay | Admitting: Nurse Practitioner

## 2013-09-29 ENCOUNTER — Ambulatory Visit (INDEPENDENT_AMBULATORY_CARE_PROVIDER_SITE_OTHER): Payer: BC Managed Care – PPO | Admitting: Nurse Practitioner

## 2013-09-29 VITALS — BP 114/68 | HR 80 | Wt 132.0 lb

## 2013-09-29 DIAGNOSIS — G43009 Migraine without aura, not intractable, without status migrainosus: Secondary | ICD-10-CM

## 2013-09-29 MED ORDER — ELETRIPTAN HYDROBROMIDE 40 MG PO TABS: 40.0000 mg | ORAL_TABLET | ORAL | Status: DC | PRN

## 2013-09-29 MED ORDER — TOPIRAMATE 25 MG PO TABS: ORAL_TABLET | ORAL | Status: DC

## 2013-09-29 NOTE — Patient Instructions (Signed)
Increase Topirimate to 1 tablet in the morning and 2 tablets at night.  IF this is not helpful for headaches afetr 1 month or side effects are intolerable, decrease to former dos eof 2 tablets each night.  Samples given for Relpax, use in the same way as Sumatriptan, take 1 tablet at onset of Migraine.  May take another tablet after 2 hours if needed.  Rx given and co-pay card. Must activate co-pay card by phone before use.  Follow up in 6 months, sooner as needed.

## 2013-09-29 NOTE — Progress Notes (Signed)
PATIENT: Paula Trevino DOB: [birth year]  REASON FOR VISIT: routine follow up for Migraine HISTORY FROM: patient  HISTORY OF PRESENT ILLNESS: 09/29/13 (LL):  Since last visit 6 months ago, headaches have picked up again in frequency as weather has gotten hotter.  Side effects of TPX diminished.  She is having more frequent headaches that have a more tight, pressure quality, but not many severe headaches.  Overall much better than before going on TPX.  She has not had great response from Sumatriptan; always needing second dose and not fully relieving headache.  Her daughter has had good response from Relpax.  03/10/13 (LL): Patient returns for 2 month follow up for Migraines, they have greatly improved on TPX. She is having some tingling in fingers and occasional facial numbness, balance seems off, nausea first few weeks (resolved). She is happy not to have severe headache though. Still having 1-2 milder headaches, treating with ibuprofen. She has not had to use Sumatriptan. She is happy with current treatment.  01/07/13 (VP): 58 year old right-handed female here for a evaluation of headaches, recurrent since 2012, but worsened since July 2014.  Patient was in her 51s, she had developed right-sided headaches with photophobia, pulsating and throbbing sensation. She took over-the-counter medications with good relief. However, she was not officially diagnosed with migraine. By late 32s and 40s, she had stopped having headaches. 2012 headaches returned. Since summer 2014, headaches have been significantly worsen. She had recurrent right-sided throbbing severe headaches with retro-orbital pain, nausea, photophobia. She was sensitive to smells. She's been taking Excedrin Migraine, Benadryl, Motrin, Tylenol as a for headaches. Patient went to the emergency room one time because of severe headaches, received headache cocktails and CT scan head which are unremarkable. Since then she's had lingering daily headaches.  She has 2-3 severe headaches per week. Strong family history of migraine headaches in patient's sister, daughter, maternal great aunt. Patient is never tried anti-migraine medications in the past. No specific triggering factors.   REVIEW OF SYSTEMS: Full 14 system review of systems performed and notable only for eye itching, frequent waking, joint pain, headache.   ALLERGIES: Allergies  Allergen Reactions  . Codeine Nausea And Vomiting  . Sulfa Antibiotics Rash    HOME MEDICATIONS: Outpatient Prescriptions Prior to Visit  Medication Sig Dispense Refill  . Acetaminophen (TYLENOL PO) Take by mouth as needed.      . ALPRAZolam (XANAX) 0.25 MG tablet Take 0.25 mg by mouth at bedtime as needed for sleep.      Marland Kitchen aspirin-acetaminophen-caffeine (EXCEDRIN MIGRAINE) 250-250-65 MG per tablet Take 1 tablet by mouth every 6 (six) hours as needed for pain.      . Calcium Carbonate-Vitamin D (CALCIUM + D PO) Take by mouth.      . diphenhydrAMINE (BENADRYL) 25 MG tablet Take 25 mg by mouth every 6 (six) hours as needed for itching.      . Ibuprofen (MOTRIN PO) Take by mouth as needed.      . SUMAtriptan (IMITREX) 50 MG tablet Take 1 tablet (50 mg total) by mouth as needed for migraine. May repeat in 2 hours if headache persists or recurs. Maximum 2 tabs in 24 hours.  10 tablet  6  . topiramate (TOPAMAX) 25 MG tablet Take 1 tablet (25 mg total) by mouth 2 (two) times daily.  60 tablet  6   No facility-administered medications prior to visit.     PHYSICAL EXAM  Filed Vitals:   09/29/13 0912  BP: 114/68  Pulse: 80  Weight: 132 lb (59.875 kg)   Body mass index is 24.95 kg/(m^2). No exam data present   Generalized: Well developed, in no acute distress  Head: normocephalic and atraumatic. Oropharynx benign  Neck: Supple, no carotid bruits  Cardiac: Regular rate rhythm, no murmur  Musculoskeletal: No deformity   Neurological examination  Mentation: Alert oriented to time, place, history  taking. Follows all commands speech and language fluent Cranial nerve II-XII: Fundoscopic exam reveals sharp disc margins. Pupils were equal round reactive to light extraocular movements were full, visual field were full on confrontational test. Facial sensation and strength were normal. hearing was intact to finger rubbing bilaterally. Uvula tongue midline. head turning and shoulder shrug and were normal and symmetric.Tongue protrusion into cheek strength was normal. Motor: The motor testing reveals 5 over 5 strength of all 4 extremities. Good symmetric motor tone is noted throughout.  Sensory: Sensory testing is intact to soft touch on all 4 extremities. No evidence of extinction is noted.  Coordination: Cerebellar testing reveals good finger-nose-finger and heel-to-shin bilaterally.  Gait and station: Gait is normal. Tandem gait is normal. Romberg is negative.  Reflexes: Deep tendon reflexes are symmetric and normal bilaterally.    ASSESSMENT AND PLAN 58 y.o. year old female here with long standing HA, now with recurrent headaches, suspicious for migraine. Neuro exam and CT head normal. ESR 5. Migraines improved on TPX, but have worsened some again in late spring.  Overall much improved since 1 year ago.  She has had only fair response from Sumatriptan, wants to try Relpax as her daughter has had relief using this.  Dx: recurrent migraine without aura   PLAN:  Try Relpax for acute Migraine, 3 boxes samples given and Rx. Increase Topirimate 25 mg to 1 tab in the am, 2 tabs at night for Migraine prevention. Headache education and strategies reviewed.  Discussed supplements such as Magnesium, Riboflavin and CoQ10.  6 blistercards of [place] samples given. Return in about 6 months with NP or Dr. Leta Baptist, sooner as needed.  Meds ordered this encounter  Medications  . topiramate (TOPAMAX) 25 MG tablet    Sig: Take 1 tablet in the morning and 2 tablets at night.    Dispense:  90 tablet     Refill:  6    Order Specific Question:  Supervising Provider    Answer:  Andrey Spearman R [[id number]]  . eletriptan (RELPAX) 40 MG tablet    Sig: Take 1 tablet (40 mg total) by mouth as needed for migraine or headache. One tablet by mouth at onset of headache. May repeat in 2 hours if headache persists or recurs.    Dispense:  10 tablet    Refill:  6    Order Specific Question:  Supervising Provider    Answer:  Penni Bombard [[id number]]   Return in about 6 months (around 03/31/2014) for Migraine.  Philmore Pali, MSN, NP-C 09/29/2013, 10:45 AM [facility] [address], [place], [place] [address] [phone]  Note: This document was prepared with digital dictation and possible smart phrase technology. Any transcriptional errors that result from this process are unintentional.

## 2013-10-16 NOTE — Progress Notes (Signed)
I reviewed note and agree with plan.   VIKRAM R. PENUMALLI, MD  Certified in Neurology, Neurophysiology and Neuroimaging  [facility] [address], [address] [place], [place] [address] [phone]   

## 2014-02-09 ENCOUNTER — Encounter: Payer: Self-pay | Admitting: Nurse Practitioner

## 2014-02-09 ENCOUNTER — Encounter: Payer: Self-pay | Admitting: Gynecology

## 2014-02-12 ENCOUNTER — Encounter: Payer: Self-pay | Admitting: *Deleted

## 2014-03-02 IMAGING — CT CT HEAD W/O CM
1 series · 16 of 30 positions shown, 20 images · non-contrast
Comparison: No priors.

CLINICAL DATA: Right-sided head and neck pain.

CT HEAD WITHOUT CONTRAST
TECHNIQUE: Contiguous axial images were obtained from the base of
the skull through the vertex without contrast.

[Series 2: headseq 4.8 h37s · axial · 0.44mm/px · z∈[+1139,+1293]mm · 16 of 36 slices shown, 20 images]
[im 2/36  brain]
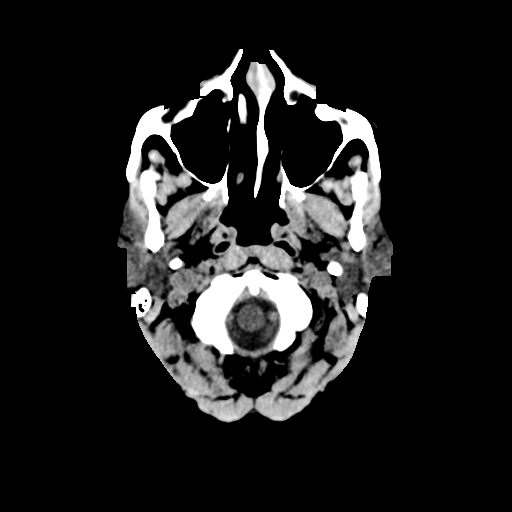
[im 2/36  bone]
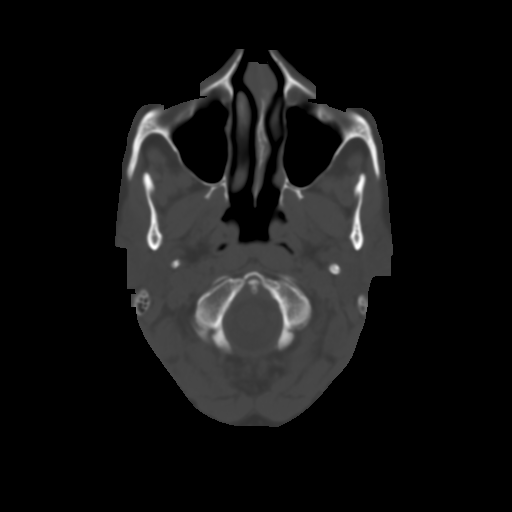
[im 4/36  brain]
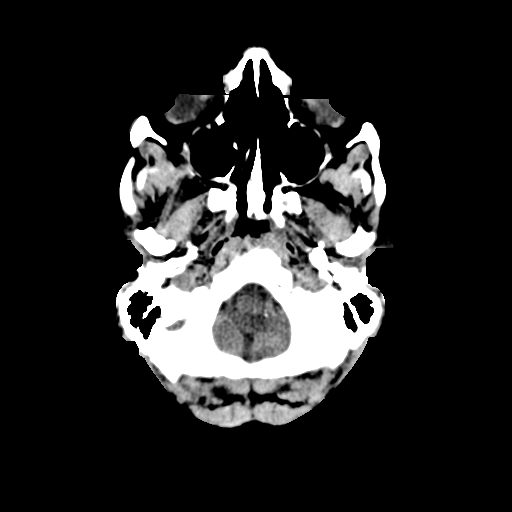
[im 7/36  brain]
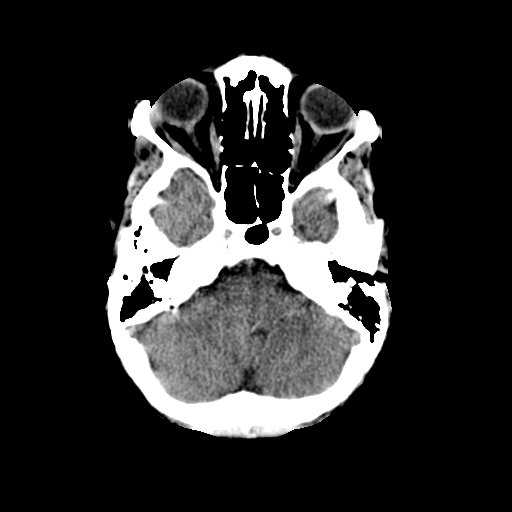
[im 9/36  brain]
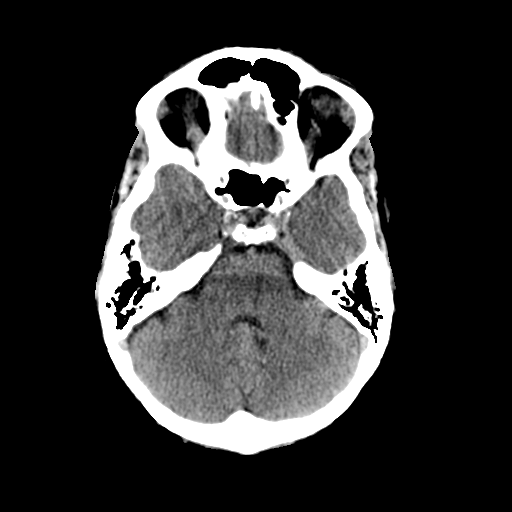
[im 10/36  brain]
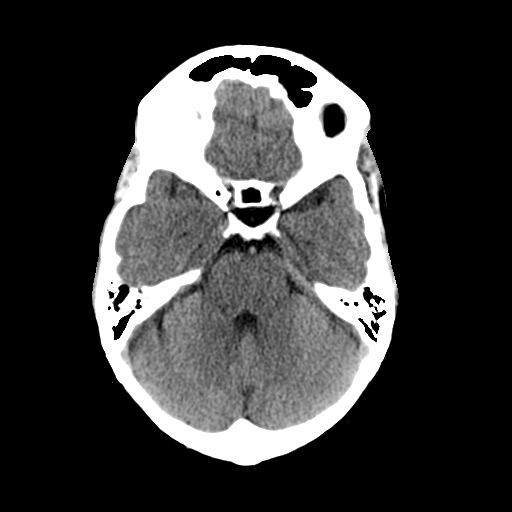
[im 10/36  bone]
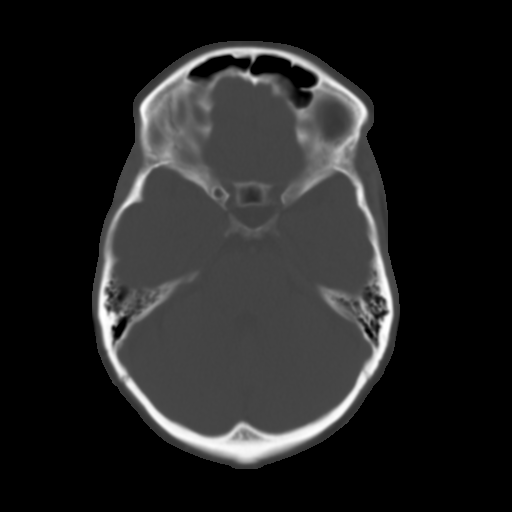
[im 13/36  brain]
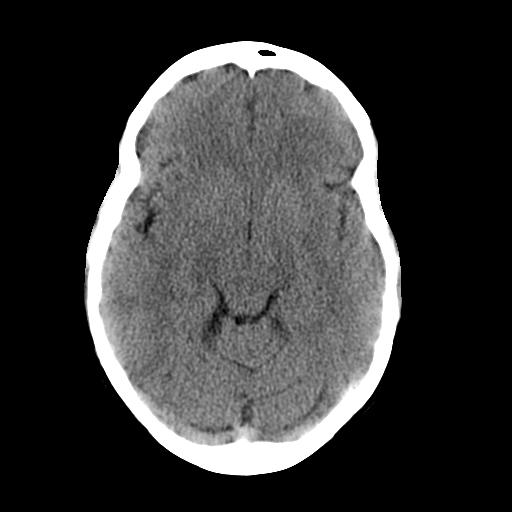
[im 15/36  brain]
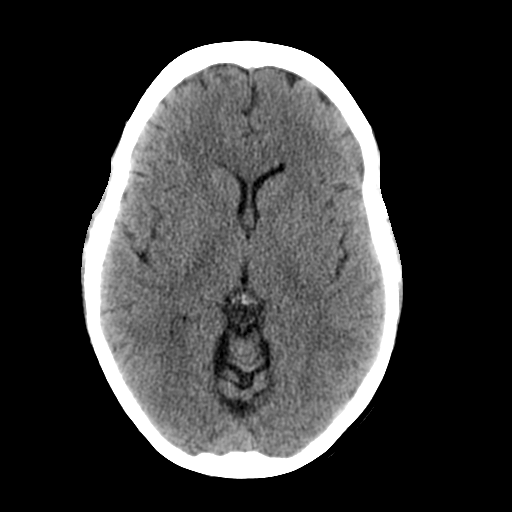
[im 17/36  brain]
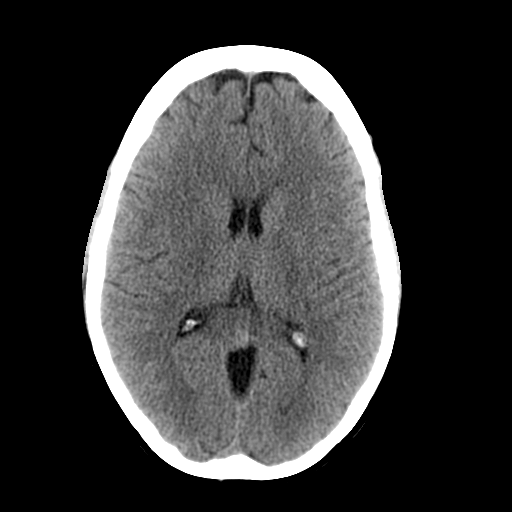
[im 19/36  brain]
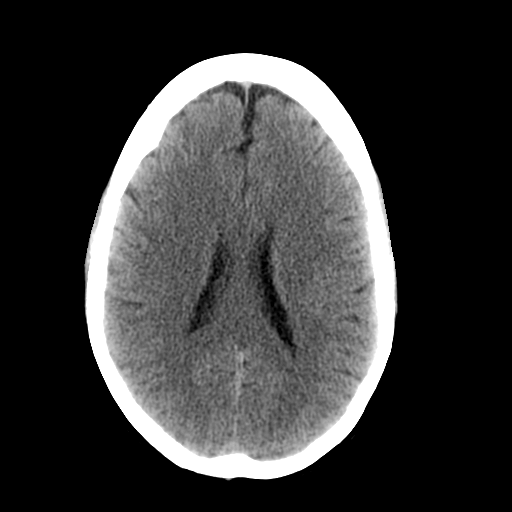
[im 19/36  bone]
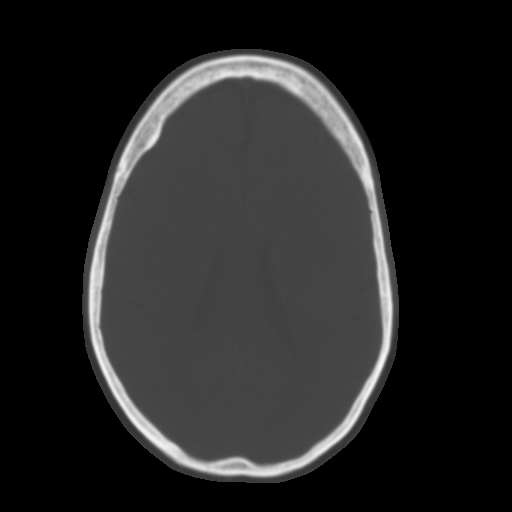
[im 21/36  brain]
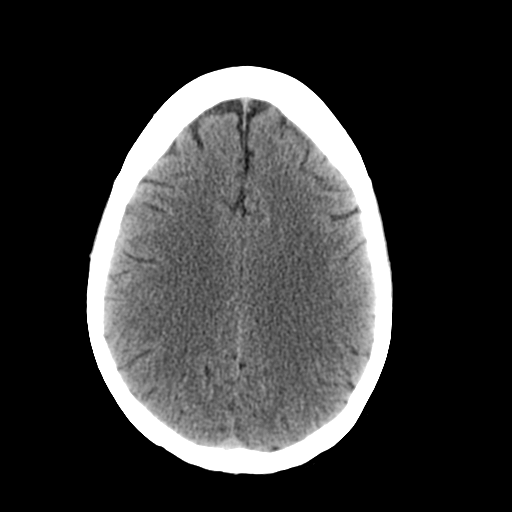
[im 23/36  brain]
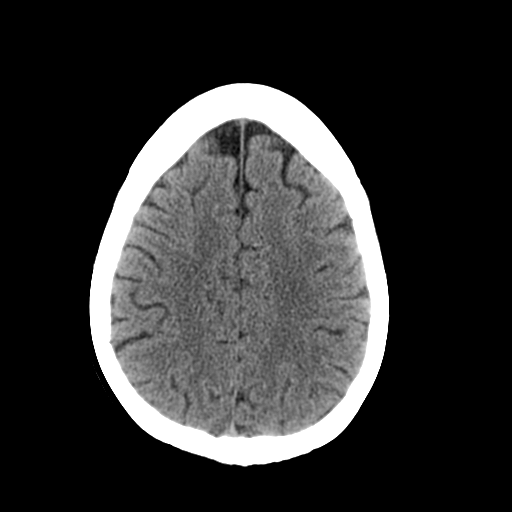
[im 26/36  brain]
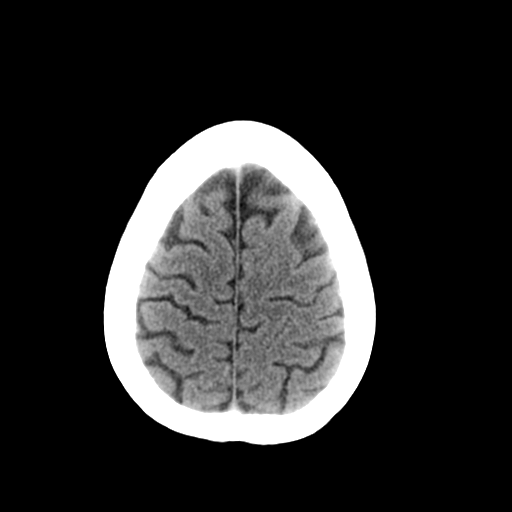
[im 27/36  brain]
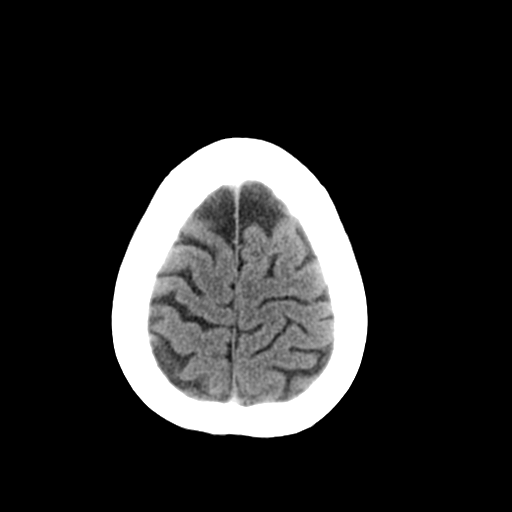
[im 27/36  bone]
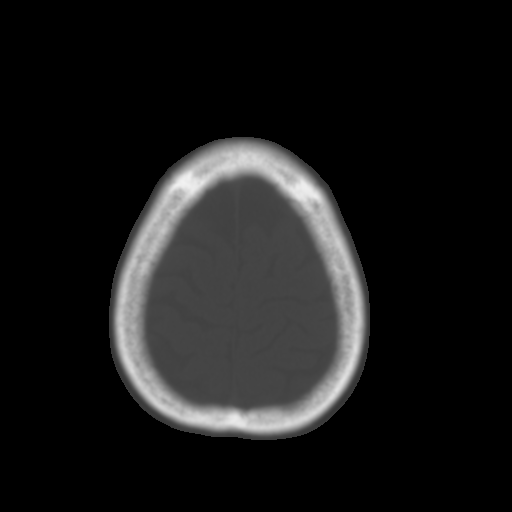
[im 29/36  brain]
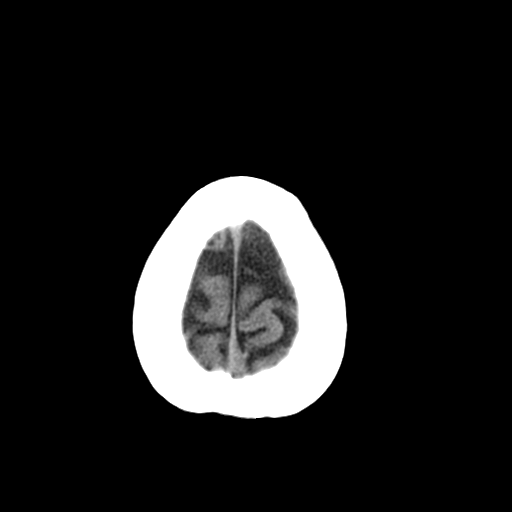
[im 32/36  brain]
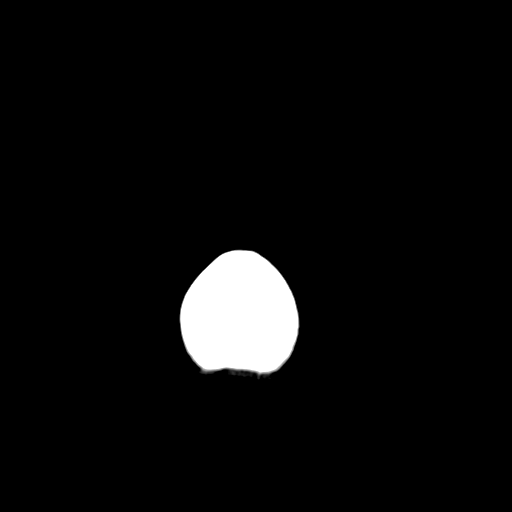
[im 34/36  brain]
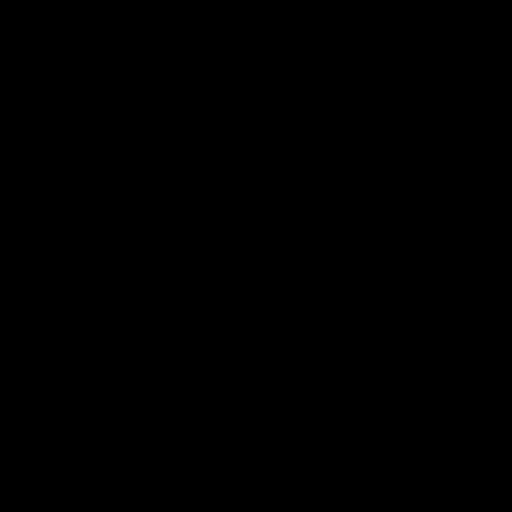

[16 of 30 positions shown; findings below may reference images not displayed]

FINDINGS: No acute intracranial abnormalities.  Specifically, no
evidence of acute intracranial hemorrhage, no definite findings of
acute/subacute cerebral ischemia, no mass, mass effect,
hydrocephalus or abnormal intra or extra-axial fluid collections. A
prominent sulcus adjacent to the cerebellar vermis on the left is
incidentally noted (favored to be a normal variant).  Visualized
paranasal sinuses and mastoids are well pneumatized.  No acute
displaced skull fractures are identified.
IMPRESSION: 1.  No acute intracranial abnormalities.

## 2014-03-27 ENCOUNTER — Ambulatory Visit (INDEPENDENT_AMBULATORY_CARE_PROVIDER_SITE_OTHER): Payer: BC Managed Care – PPO | Admitting: Neurology

## 2014-03-27 ENCOUNTER — Encounter: Payer: Self-pay | Admitting: Neurology

## 2014-03-27 VITALS — BP 103/66 | HR 74 | Temp 97.3°F | Resp 16 | Ht 60.5 in | Wt 132.4 lb

## 2014-03-27 DIAGNOSIS — R309 Painful micturition, unspecified: Secondary | ICD-10-CM

## 2014-03-27 DIAGNOSIS — Z79899 Other long term (current) drug therapy: Secondary | ICD-10-CM

## 2014-03-27 MED ORDER — ALPRAZOLAM 0.25 MG PO TABS: 0.2500 mg | ORAL_TABLET | Freq: Every evening | ORAL | Status: DC | PRN

## 2014-03-27 MED ORDER — TOPIRAMATE 25 MG PO TABS: ORAL_TABLET | ORAL | Status: DC

## 2014-03-27 MED ORDER — ELETRIPTAN HYDROBROMIDE 40 MG PO TABS: 40.0000 mg | ORAL_TABLET | ORAL | Status: DC | PRN

## 2014-03-27 NOTE — Patient Instructions (Signed)
Overall you are doing fairly well but I do want to suggest a few things today:   Remember to drink plenty of fluid, eat healthy meals and do not skip any meals. Try to eat protein with a every meal and eat a healthy snack such as fruit or nuts in between meals. Try to keep a regular sleep-wake schedule and try to exercise daily, particularly in the form of walking, 20-30 minutes a day, if you can.   As far as your medications are concerned, I would like to suggest: Continue Topamax. Relpax for acute headache. Try the belsomra for insomnia. Please try to limit Xanax use as needed due to addiction potential and rebound insomnia if used too frequently.  As far as diagnostic testing: Labwork today  I would like to see you back in 6 months, sooner if we need to. Please call us with any interim questions, concerns, problems, updates or refill requests.   Please also call us for any test results so we can go over those with you on the phone.  My clinical assistant and will answer any of your questions and relay your messages to me and also relay most of my messages to you.   Our phone number is [phone]. We also have an after hours call service for urgent matters and there is a physician on-call for urgent questions. For any emergencies you know to call 911 or go to the nearest emergency room

## 2014-03-27 NOTE — Progress Notes (Signed)
[facility]    Provider:  Dr Jaynee Eagles Referring Provider: Sharilyn Sites, MD Primary Care Physician:  Purvis Kilts, MD  CC:  Migraine  HPI:  Paula Trevino is a 58 y.o. female here as a follow up for migraine. She is transitioning to my care.  HISTORY OF PRESENT ILLNESS:  Interval History 03/27/2014:  When she is not drinking enough water she gets burning urination.  She is not sleeping well. She used to take Xanax. She is not getting any migraines. Has some tingling in the fingers occasionally but otherwise doing well.  She falls asleep but she wakes up at midnight and can't go to sleep for hours. Doesn't know why she is waking up, possibly to urinate then can't go back to sleep. She used to take Xanax every night but got off of it. She is not a good sleeper.   09/29/13 (LL): Since last visit 6 months ago, headaches have picked up again in frequency as weather has gotten hotter. Side effects of TPX diminished. She is having more frequent headaches that have a more tight, pressure quality, but not many severe headaches. Overall much better than before going on TPX. She has not had great response from Sumatriptan; always needing second dose and not fully relieving headache. Her daughter has had good response from Relpax.  03/10/13 (LL): Patient returns for 2 month follow up for Migraines, they have greatly improved on TPX. She is having some tingling in fingers and occasional facial numbness, balance seems off, nausea first few weeks (resolved). She is happy not to have severe headache though. Still having 1-2 milder headaches, treating with ibuprofen. She has not had to use Sumatriptan. She is happy with current treatment.   01/07/13 (VP): 58 year old right-handed female here for a evaluation of headaches, recurrent since 2012, but worsened since July 2014. Patient was in her 66s, she had developed right-sided headaches with photophobia, pulsating and throbbing sensation.  She took over-the-counter medications with good relief. However, she was not officially diagnosed with migraine. By late 31s and 40s, she had stopped having headaches. 2012 headaches returned. Since summer 2014, headaches have been significantly worsen. She had recurrent right-sided throbbing severe headaches with retro-orbital pain, nausea, photophobia. She was sensitive to smells. She's been taking Excedrin Migraine, Benadryl, Motrin, Tylenol as a for headaches. Patient went to the emergency room one time because of severe headaches, received headache cocktails and CT scan head which are unremarkable. Since then she's had lingering daily headaches. She has 2-3 severe headaches per week. Strong family history of migraine headaches in patient's sister, daughter, maternal great aunt. Patient is never tried anti-migraine medications in the past. No specific triggering factors.   Review of Systems: Patient complains of symptoms per HPI as well as the following symptoms: eye itching, burning urination. Pertinent negatives per HPI. All others negative.   History   Social History  . Marital Status: Widowed    Spouse Name: N/A    Number of Children: 1  . Years of Education: BS   Occupational History  . Retired     Pharmacist, hospital   Social History Main Topics  . Smoking status: Current Some Day Smoker -- 0.50 packs/day    Types: Cigarettes  . Smokeless tobacco: Never Used  . Alcohol Use: Yes     Comment: Rare; 1 glass of wine  . Drug Use: No  . Sexual Activity: No   Other Topics Concern  . Not on file   Social History Narrative  Patient lives at home alone.   Caffeine Use: 1 soda and 2 cups of coffee daily    Family History  Problem Relation Age of Onset  . Hypertension Mother   . Heart disease Father   . Stroke Maternal Grandmother   . Heart disease Paternal Grandmother   . Cancer Paternal Grandfather     Stomach  . Migraines Sister   . Migraines Maternal Aunt     great aunt  .  Migraines Daughter     Past Medical History  Diagnosis Date  . IBS (irritable bowel syndrome)   . Colon polyps   . Arthritis     Past Surgical History  Procedure Laterality Date  . Cholecystectomy  1997  . Cesarean section  1991  . Shoulder surgery      Current Outpatient Prescriptions  Medication Sig Dispense Refill  . Acetaminophen (TYLENOL PO) Take by mouth as needed.    . ALPRAZolam (XANAX) 0.25 MG tablet Take 0.25 mg by mouth at bedtime as needed for sleep.    Marland Kitchen aspirin-acetaminophen-caffeine (EXCEDRIN MIGRAINE) 250-250-65 MG per tablet Take 1 tablet by mouth every 6 (six) hours as needed for pain.    . Calcium Carbonate-Vitamin D (CALCIUM + D PO) Take by mouth.    . diphenhydrAMINE (BENADRYL) 25 MG tablet Take 25 mg by mouth every 6 (six) hours as needed for itching.    . eletriptan (RELPAX) 40 MG tablet Take 1 tablet (40 mg total) by mouth as needed for migraine or headache. One tablet by mouth at onset of headache. May repeat in 2 hours if headache persists or recurs. 10 tablet 6  . Ibuprofen (MOTRIN PO) Take by mouth as needed.    . SUMAtriptan (IMITREX) 50 MG tablet Take 1 tablet (50 mg total) by mouth as needed for migraine. May repeat in 2 hours if headache persists or recurs. Maximum 2 tabs in 24 hours. 10 tablet 6  . topiramate (TOPAMAX) 25 MG tablet Take 1 tablet in the morning and 2 tablets at night. 90 tablet 6   No current facility-administered medications for this visit.    Allergies as of 03/27/2014 - Review Complete 09/29/2013  Allergen Reaction Noted  . Codeine Nausea And Vomiting 08/30/2012  . Sulfa antibiotics Rash 08/29/2012    Vitals: There were no vitals taken for this visit. Last Weight:  Wt Readings from Last 1 Encounters:  09/29/13 132 lb (59.875 kg)   Last Height:   Ht Readings from Last 1 Encounters:  03/10/13 _0  (1.549 m)   Physical exam: Exam: Gen: NAD, conversant, well nourised, obese, well groomed                     CV: RRR,  no MRG. No Carotid Bruits. No peripheral edema, warm, nontender Eyes: Conjunctivae clear without exudates or hemorrhage  Neuro: Detailed Neurologic Exam  Speech:    Speech is normal; fluent and spontaneous with normal comprehension.  Cognition:    The patient is oriented to person, place, and time;     recent and remote memory intact;     language fluent;     normal attention, concentration,     fund of knowledge Cranial Nerves:    The pupils are equal, round, and reactive to light. The fundi are normal and spontaneous venous pulsations are present. Visual fields are full to finger confrontation. Extraocular movements are intact. Trigeminal sensation is intact and the muscles of mastication are normal. The face is symmetric. The  palate elevates in the midline. Voice is normal. Shoulder shrug is normal. The tongue has normal motion without fasciculations.   Coordination:    Normal finger to nose and heel to shin. Normal rapid alternating movements.   Gait:    Heel-toe and tandem gait are normal.   Motor Observation:    No asymmetry, no atrophy, and no involuntary movements noted. Tone:    Normal muscle tone.    Posture:    Posture is normal. normal erect    Strength:    Strength is V/V in the upper and lower limbs.      Sensation: intact     Reflex Exam:  DTR's:    Deep tendon reflexes in the upper and lower extremities are normal bilaterally.   Toes:    The toes are downgoing bilaterally.   Clonus:    Clonus is absent.    ASSESSMENT AND PLAN 58 y.o. year old female here with long standing HA, now with recurrent headaches, suspicious for migraine. Neuro exam and CT head normal. ESR 5. Migraines improved on TPX, but have worsened some again in late spring. Overall much improved since 1 year ago. She has had only fair response from Sumatriptan, wants to try Relpax as her daughter has had relief using this.  Dx: recurrent migraine without aura   PLAN:  Burning  urination: will order urinalysis and CMP Insomnia: Can try Belsomra as needed For Migraine: Try Relpax for acute Migraine, 3 boxes samples given and Rx. Continue Topirimate 25 mg to 1 tab in the am, 2 tabs at night for Migraine prevention. Headache education and strategies reviewed. Discussed supplements such as Magnesium, Riboflavin and CoQ10.   To prevent or relieve headaches, try the following: Cool Compress. Lie down and place a cool compress on your head.  Avoid headache triggers. If certain foods or odors seem to have triggered your migraines in the past, avoid them. A headache diary might help you identify triggers.  Include physical activity in your daily routine. Try a daily walk or other moderate aerobic exercise.  Manage stress. Find healthy ways to cope with the stressors, such as delegating tasks on your to-do list.  Practice relaxation techniques. Try deep breathing, yoga, massage and visualization.  Eat regularly. Eating regularly scheduled meals and maintaining a healthy diet might help prevent headaches. Also, drink plenty of fluids.  Follow a regular sleep schedule. Sleep deprivation might contribute to headaches Consider biofeedback. With this mind-body technique, you learn to control certain bodily functions - such as muscle tension, heart rate and blood pressure - to prevent headaches or reduce headache pain.    Proceed to emergency room if you experience new or worsening symptoms or symptoms do not resolve, if you have new neurologic symptoms or if headache is severe, or for any concerning symptom.  Sarina Ill, MD  [facility] [address] [place], [place] [address]  Phone [phone] Fax [phone]

## 2014-03-28 LAB — COMPREHENSIVE METABOLIC PANEL
A/G RATIO: 2.1 (ref 1.1–2.5)
ALT: 12 IU/L (ref 0–32)
AST: 16 IU/L (ref 0–40)
Albumin: 4.8 g/dL (ref 3.5–5.5)
Alkaline Phosphatase: 82 IU/L (ref 39–117)
BILIRUBIN TOTAL: 0.4 mg/dL (ref 0.0–1.2)
BUN/Creatinine Ratio: 19 (ref 9–23)
BUN: 15 mg/dL (ref 6–24)
CO2: 21 mmol/L (ref 18–29)
Calcium: 9.6 mg/dL (ref 8.7–10.2)
Chloride: 102 mmol/L (ref 97–108)
Creatinine, Ser: 0.77 mg/dL (ref 0.57–1.00)
GFR, EST AFRICAN AMERICAN: 98 mL/min/{1.73_m2} (ref >59)
GFR, EST NON AFRICAN AMERICAN: 85 mL/min/{1.73_m2} (ref >59)
Globulin, Total: 2.3 g/dL (ref 1.5–4.5)
Glucose: 99 mg/dL (ref 65–99)
POTASSIUM: 4.8 mmol/L (ref 3.5–5.2)
SODIUM: 140 mmol/L (ref 134–144)
Total Protein: 7.1 g/dL (ref 6.0–8.5)

## 2014-03-28 LAB — URINALYSIS, ROUTINE W REFLEX MICROSCOPIC
Bilirubin, UA: NEGATIVE
Glucose, UA: NEGATIVE
Ketones, UA: NEGATIVE
LEUKOCYTES UA: NEGATIVE
NITRITE UA: NEGATIVE
PH UA: 6.5 (ref 5.0–7.5)
PROTEIN UA: NEGATIVE
RBC UA: NEGATIVE
SPEC GRAV UA: 1.011 (ref 1.005–1.030)
Urobilinogen, Ur: 0.2 mg/dL (ref 0.2–1.0)

## 2014-03-28 MED ORDER — ELETRIPTAN HYDROBROMIDE 40 MG PO TABS: 40.0000 mg | ORAL_TABLET | ORAL | Status: DC | PRN

## 2014-03-28 MED ORDER — SUVOREXANT 10 MG PO TABS: 10.0000 mg | ORAL_TABLET | Freq: Every day | ORAL | Status: DC

## 2014-03-28 NOTE — Addendum Note (Signed)
Addended by: Naomie DeanAHERN, Christianna Belmonte B on: 03/28/2014 10:07 PM   Modules accepted: Orders

## 2014-09-21 ENCOUNTER — Telehealth: Payer: Self-pay | Admitting: *Deleted

## 2014-09-21 NOTE — Telephone Encounter (Signed)
Called and rescheduled appt on 09/28/14 to 10/01/14 at 1:15pm and told her to arrive at 1:00pm. I told her to call 24 hours at least in advance to reschdule, pt verbalized understanding.

## 2014-09-28 ENCOUNTER — Ambulatory Visit: Payer: BC Managed Care – PPO | Admitting: Neurology

## 2014-10-01 ENCOUNTER — Ambulatory Visit (INDEPENDENT_AMBULATORY_CARE_PROVIDER_SITE_OTHER): Payer: BC Managed Care – PPO | Admitting: Neurology

## 2014-10-01 ENCOUNTER — Encounter: Payer: Self-pay | Admitting: Neurology

## 2014-10-01 VITALS — BP 111/64 | HR 79 | Temp 97.9°F | Ht 61.0 in | Wt 133.2 lb

## 2014-10-01 DIAGNOSIS — G4719 Other hypersomnia: Secondary | ICD-10-CM | POA: Diagnosis not present

## 2014-10-01 DIAGNOSIS — R519 Headache, unspecified: Secondary | ICD-10-CM | POA: Insufficient documentation

## 2014-10-01 DIAGNOSIS — R51 Headache: Secondary | ICD-10-CM | POA: Diagnosis not present

## 2014-10-01 DIAGNOSIS — R0683 Snoring: Secondary | ICD-10-CM

## 2014-10-01 MED ORDER — ELETRIPTAN HYDROBROMIDE 40 MG PO TABS: 40.0000 mg | ORAL_TABLET | Freq: Once | ORAL | Status: DC

## 2014-10-01 MED ORDER — CYCLOBENZAPRINE HCL 10 MG PO TABS: 10.0000 mg | ORAL_TABLET | Freq: Every day | ORAL | Status: DC

## 2014-10-01 MED ORDER — ALPRAZOLAM 0.25 MG PO TABS: 0.2500 mg | ORAL_TABLET | Freq: Every evening | ORAL | Status: DC | PRN

## 2014-10-01 MED ORDER — TOPIRAMATE 25 MG PO TABS: ORAL_TABLET | ORAL | Status: DC

## 2014-10-01 NOTE — Progress Notes (Signed)
[facility]    Provider:  Dr Lucia Gaskins Referring Provider: Assunta Found, MD Primary Care Physician:  Colette Ribas, MD  CC: Migraine  Interval History 10/01/2014: Paula Trevino is a 59 y.o. female here as a follow up for migraine. She is transitioning to my care.She is waking up with headachesand thinks she is clenching her teeth or snoring. She is going to the dentist to see if teeth clenching is causing her headaches. She feels lightheaded and some tingling every once in a while but otherwise no side effects from the Topiramate. She takes one Topiramate the morning and 2 at night. Relpax helps with acute management. Snores at night. Excessive daytime sleepiness. Morning headache. ESS 14.  HISTORY OF PRESENT ILLNESS:  Interval History 03/27/2014: When she is not drinking enough water she gets burning urination. She is not sleeping well. She used to take Xanax. She is not getting any migraines. Has some tingling in the fingers occasionally but otherwise doing well. She falls asleep but she wakes up at midnight and can't go to sleep for hours. Doesn't know why she is waking up, possibly to urinate then can't go back to sleep. She used to take Xanax every night but got off of it. She is not a good sleeper.   09/29/13 (LL): Since last visit 6 months ago, headaches have picked up again in frequency as weather has gotten hotter. Side effects of TPX diminished. She is having more frequent headaches that have a more tight, pressure quality, but not many severe headaches. Overall much better than before going on TPX. She has not had great response from Sumatriptan; always needing second dose and not fully relieving headache. Her daughter has had good response from Relpax.  03/10/13 (LL): Patient returns for 2 month follow up for Migraines, they have greatly improved on TPX. She is having some tingling in fingers and occasional facial numbness, balance seems off, nausea first  few weeks (resolved). She is happy not to have severe headache though. Still having 1-2 milder headaches, treating with ibuprofen. She has not had to use Sumatriptan. She is happy with current treatment.   01/07/13 (VP): 59 year old right-handed female here for a evaluation of headaches, recurrent since 2012, but worsened since July 2014. Patient was in her 6s, she had developed right-sided headaches with photophobia, pulsating and throbbing sensation. She took over-the-counter medications with good relief. However, she was not officially diagnosed with migraine. By late 30s and 40s, she had stopped having headaches. 2012 headaches returned. Since summer 2014, headaches have been significantly worsen. She had recurrent right-sided throbbing severe headaches with retro-orbital pain, nausea, photophobia. She was sensitive to smells. She's been taking Excedrin Migraine, Benadryl, Motrin, Tylenol as a for headaches. Patient went to the emergency room one time because of severe headaches, received headache cocktails and CT scan head which are unremarkable. Since then she's had lingering daily headaches. She has 2-3 severe headaches per week. Strong family history of migraine headaches in patient's sister, daughter, maternal great aunt. Patient is never tried anti-migraine medications in the past. No specific triggering factors  Review of Systems: Patient complains of symptoms per HPI as well as the following symptoms: eye itching, allergies, frequent waking, daytime sleepiess. Pertinent negatives per HPI. All others negative.   History   Social History  . Marital Status: Widowed    Spouse Name: N/A  . Number of Children: 1  . Years of Education: BS   Occupational History  . Retired  Teacher   Social History Main Topics  . Smoking status: Current Some Day Smoker -- 0.50 packs/day    Types: Cigarettes  . Smokeless tobacco: Never Used  . Alcohol Use: Yes     Comment: Rare; 1 glass of wine  .  Drug Use: No  . Sexual Activity: No   Other Topics Concern  . Not on file   Social History Narrative   Patient lives at home alone.   Caffeine Use: 1 soda and 2 cups of coffee daily    Family History  Problem Relation Age of Onset  . Hypertension Mother   . Heart disease Father   . Stroke Maternal Grandmother   . Heart disease Paternal Grandmother   . Cancer Paternal Grandfather     Stomach  . Migraines Sister   . Migraines Maternal Aunt     great aunt  . Migraines Daughter     Past Medical History  Diagnosis Date  . IBS (irritable bowel syndrome)   . Colon polyps   . Arthritis     Past Surgical History  Procedure Laterality Date  . Cholecystectomy  1997  . Cesarean section  1991  . Shoulder surgery      Current Outpatient Prescriptions  Medication Sig Dispense Refill  . Acetaminophen (TYLENOL PO) Take by mouth as needed.    . ALPRAZolam (XANAX) 0.25 MG tablet Take 1 tablet (0.25 mg total) by mouth at bedtime as needed for sleep. 15 tablet 4  . Calcium Carbonate-Vitamin D (CALCIUM + D PO) Take by mouth.    . diphenhydrAMINE (BENADRYL) 25 MG tablet Take 25 mg by mouth every 6 (six) hours as needed for itching.    . eletriptan (RELPAX) 40 MG tablet Take 1 tablet (40 mg total) by mouth once. One tablet by mouth at onset of headache. May repeat in 2 hours if headache persists or recurs. 10 tablet 6  . Ibuprofen (MOTRIN PO) Take by mouth as needed.    . topiramate (TOPAMAX) 25 MG tablet Take 1 tablet in the morning and 2 tablets at night. 90 tablet 6  . aspirin-acetaminophen-caffeine (EXCEDRIN MIGRAINE) 250-250-65 MG per tablet Take 1 tablet by mouth every 6 (six) hours as needed for pain.    . cyclobenzaprine (FLEXERIL) 10 MG tablet Take 1 tablet (10 mg total) by mouth at bedtime. 90 tablet 6   No current facility-administered medications for this visit.    Allergies as of 10/01/2014 - Review Complete 10/01/2014  Allergen Reaction Noted  . Celecoxib Hives  10/01/2014  . Codeine Nausea And Vomiting 08/30/2012  . Morphine Other (See Comments) 10/01/2014  . Sumatriptan Other (See Comments) 10/01/2014  . Sulfa antibiotics Rash and Hives 08/29/2012    Vitals: BP 111/64 mmHg  Pulse 79  Temp(Src) 97.9 F (36.6 C)  Ht  (1.549 m)  Wt 133 lb 3.2 oz (60.419 kg)  BMI 25.18 kg/m2 Last Weight:  Wt Readings from Last 1 Encounters:  10/01/14 133 lb 3.2 oz (60.419 kg)   Last Height:   Ht Readings from Last 1 Encounters:  10/01/14  (1.549 m)    Speech:  Speech is normal; fluent and spontaneous with normal comprehension.  Cognition:  The patient is oriented to person, place, and time;   recent and remote memory intact;   language fluent;   normal attention, concentration,   fund of knowledge Cranial Nerves:  The pupils are equal, round, and reactive to light. The fundi are normal and spontaneous venous pulsations are  present. Visual fields are full to finger confrontation. Extraocular movements are intact. Trigeminal sensation is intact and the muscles of mastication are normal. The face is symmetric. The palate elevates in the midline. Voice is normal. Shoulder shrug is normal. The tongue has normal motion without fasciculations.   Coordination:  Normal finger to nose and heel to shin. Normal rapid alternating movements.   Gait:  Heel-toe and tandem gait are normal.   Motor Observation:  No asymmetry, no atrophy, and no involuntary movements noted. Tone:  Normal muscle tone.   Posture:  Posture is normal. normal erect   Strength:  Strength is V/V in the upper and lower limbs.    Sensation: intact   Reflex Exam:  DTR's:  Deep tendon reflexes in the upper and lower extremities are normal bilaterally.  Toes:  The toes are downgoing bilaterally.  Clonus:  Clonus is absent.    ASSESSMENT AND PLAN 59 y.o. year old female here for f/up of headache. She appears well  controlled on Topamax and Relpax. Now with new morning headaches, possibly jaw clenching and excessive daytime sleepiness. ESS 13. Will ask for sleep consult. Will try Flexeril at night. Continue Xanax prn for insomnia, topamax for headache, and relpax for acute management.   Naomie Dean, MD  [facility] [address] [place], [place] [address]  Phone [phone] Fax [phone]  A total of 15 minutes was spent face-to-face with this patient. Over half this time was spent on counseling patient on the headaches and sleepiness diagnosis and different diagnostic and therapeutic options available.

## 2014-10-01 NOTE — Patient Instructions (Signed)
Overall you are doing fairly well but I do want to suggest a few things today:   Remember to drink plenty of fluid, eat healthy meals and do not skip any meals. Try to eat protein with a every meal and eat a healthy snack such as fruit or nuts in between meals. Try to keep a regular sleep-wake schedule and try to exercise daily, particularly in the form of walking, 20-30 minutes a day, if you can.   As far as your medications are concerned, I would like to suggest:  Flexeril at night Continue topamax and Relpax  As far as diagnostic testing: sleep eval  I would like to see you back in 1 year, sooner if we need to. Please call us with any interim questions, concerns, problems, updates or refill requests.   Please also call us for any test results so we can go over those with you on the phone.  My clinical assistant and will answer any of your questions and relay your messages to me and also relay most of my messages to you.   Our phone number is [phone]. We also have an after hours call service for urgent matters and there is a physician on-call for urgent questions. For any emergencies you know to call 911 or go to the nearest emergency room

## 2014-12-01 ENCOUNTER — Institutional Professional Consult (permissible substitution): Payer: BC Managed Care – PPO | Admitting: Neurology

## 2015-01-12 ENCOUNTER — Telehealth: Payer: Self-pay | Admitting: Neurology

## 2015-01-12 NOTE — Telephone Encounter (Signed)
Pt called and is stating that her pharmacy will not fill her topiramate (TOPAMAX) 25 MG tablet. They are telling her that they will have to contact our office. Pt wanted to call and see what was going on. Pt should have 2 more refills. Pt will not need to come in until next year for her yearly and will also need a rx to last another 6 months. Please call and advise [phone]. Pt is currently out of medication.

## 2015-01-12 NOTE — Telephone Encounter (Signed)
It appears the patient should have several refills remaining.  I called the pharmacy to clarify.  Spoke with Sharia Reeve who said there are no issues with this Rx, and in fact it is ready for pick up.  I called the patient back.  She is aware.

## 2015-03-17 ENCOUNTER — Encounter: Payer: Self-pay | Admitting: Gynecology

## 2015-08-16 LAB — BASIC METABOLIC PANEL: GLUCOSE: 86 mg/dL

## 2015-10-03 ENCOUNTER — Other Ambulatory Visit: Payer: Self-pay | Admitting: Neurology

## 2015-10-04 ENCOUNTER — Ambulatory Visit (INDEPENDENT_AMBULATORY_CARE_PROVIDER_SITE_OTHER): Payer: BC Managed Care – PPO | Admitting: Neurology

## 2015-10-04 ENCOUNTER — Encounter: Payer: Self-pay | Admitting: Neurology

## 2015-10-04 VITALS — BP 102/64 | HR 71 | Ht 61.0 in | Wt 133.4 lb

## 2015-10-04 DIAGNOSIS — G43009 Migraine without aura, not intractable, without status migrainosus: Secondary | ICD-10-CM | POA: Diagnosis not present

## 2015-10-04 DIAGNOSIS — G4700 Insomnia, unspecified: Secondary | ICD-10-CM

## 2015-10-04 DIAGNOSIS — F411 Generalized anxiety disorder: Secondary | ICD-10-CM

## 2015-10-04 DIAGNOSIS — Z716 Tobacco abuse counseling: Secondary | ICD-10-CM | POA: Diagnosis not present

## 2015-10-04 DIAGNOSIS — F17200 Nicotine dependence, unspecified, uncomplicated: Secondary | ICD-10-CM

## 2015-10-04 DIAGNOSIS — Z720 Tobacco use: Secondary | ICD-10-CM

## 2015-10-04 MED ORDER — ALPRAZOLAM 0.25 MG PO TABS: 0.2500 mg | ORAL_TABLET | Freq: Every evening | ORAL | Status: DC | PRN

## 2015-10-04 MED ORDER — BUPROPION HCL ER (SR) 150 MG PO TB12: 150.0000 mg | ORAL_TABLET | Freq: Two times a day (BID) | ORAL | Status: DC

## 2015-10-04 MED ORDER — TOPIRAMATE 25 MG PO TABS: ORAL_TABLET | ORAL | Status: DC

## 2015-10-04 MED ORDER — CYCLOBENZAPRINE HCL 10 MG PO TABS: 10.0000 mg | ORAL_TABLET | Freq: Every day | ORAL | Status: DC

## 2015-10-04 NOTE — Patient Instructions (Addendum)
Remember to drink plenty of fluid, eat healthy meals and do not skip any meals. Try to eat protein with a every meal and eat a healthy snack such as fruit or nuts in between meals. Try to keep a regular sleep-wake schedule and try to exercise daily, particularly in the form of walking, 20-30 minutes a day, if you can.   As far as your medications are concerned, I would like to suggest: Wellbutrin SR  for 3 days then increase to  twice daily Take Wellbutrin for 5-7 days then stop smoking Xanax as needed for sleep or anxiety Continue the Toapamax. Flexeril at night for jaw clenching  Silas Flood Hypnotist, for smoking, [phone] $300 for 3 sessions,   I would like to see you back in 1 year, sooner if we need to. Please call us with any interim questions, concerns, problems, updates or refill requests.   Our phone number is [phone]. We also have an after hours call service for urgent matters and there is a physician on-call for urgent questions. For any emergencies you know to call 911 or go to the nearest emergency room  Bupropion sustained-release tablets (Depression/Mood Disorders) What is this medicine? BUPROPION (byoo PROE pee on) is used to treat depression. This medicine may be used for other purposes; ask your health care provider or pharmacist if you have questions. What should I tell my health care provider before I take this medicine? They need to know if you have any of these conditions: -an eating disorder, such as anorexia or bulimia -bipolar disorder or psychosis -diabetes or high blood sugar, treated with medication -glaucoma -head injury or brain tumor -heart disease, previous heart attack, or irregular heart beat -high blood pressure -kidney or liver disease -seizures -suicidal thoughts or a previous suicide attempt -Tourette's syndrome -weight loss -an unusual or allergic reaction to bupropion, other medicines, foods, dyes, or  preservatives -breast-feeding -pregnant or trying to become pregnant How should I use this medicine? Take this medicine by mouth with a glass of water. Follow the directions on the prescription label. You can take it with or without food. If it upsets your stomach, take it with food. Do not cut, crush or chew this medicine. Take your medicine at regular intervals. If you take this medicine more than once a day, take your second dose at least 8 hours after you take your first dose. To limit difficulty in sleeping, avoid taking this medicine at bedtime. Do not take your medicine more often than directed. Do not stop taking this medicine suddenly except upon the advice of your doctor. Stopping this medicine too quickly may cause serious side effects or your condition may worsen. A special MedGuide will be given to you by the pharmacist with each prescription and refill. Be sure to read this information carefully each time. Talk to your pediatrician regarding the use of this medicine in children. Special care may be needed. Overdosage: If you think you have taken too much of this medicine contact a poison control center or emergency room at once. NOTE: This medicine is only for you. Do not share this medicine with others. What if I miss a dose? If you miss a dose, skip the missed dose and take your next tablet at the regular time. There should be at least 8 hours between doses. Do not take double or extra doses. What may interact with this medicine? Do not take this medicine with any of the following medications: -linezolid -MAOIs like Azilect, Carbex, Eldepryl,  Marplan, Nardil, and Parnate -methylene blue (injected into a vein) -other medicines that contain bupropion like Zyban This medicine may also interact with the following medications: -alcohol -certain medicines for anxiety or sleep -certain medicines for blood pressure like metoprolol, propranolol -certain medicines for depression or  psychotic disturbances -certain medicines for HIV or AIDS like efavirenz, lopinavir, nelfinavir, ritonavir -certain medicines for irregular heart beat like propafenone, flecainide -certain medicines for Parkinson's disease like amantadine, levodopa -certain medicines for seizures like carbamazepine, phenytoin, phenobarbital -cimetidine -clopidogrel -cyclophosphamide -furazolidone -isoniazid -nicotine -orphenadrine -procarbazine -steroid medicines like prednisone or cortisone -stimulant medicines for attention disorders, weight loss, or to stay awake -tamoxifen -theophylline -thiotepa -ticlopidine -tramadol -warfarin This list may not describe all possible interactions. Give your health care provider a list of all the medicines, herbs, non-prescription drugs, or dietary supplements you use. Also tell them if you smoke, drink alcohol, or use illegal drugs. Some items may interact with your medicine. What should I watch for while using this medicine? Tell your doctor if your symptoms do not get better or if they get worse. Visit your doctor or health care professional for regular checks on your progress. Because it may take several weeks to see the full effects of this medicine, it is important to continue your treatment as prescribed by your doctor. Patients and their families should watch out for new or worsening thoughts of suicide or depression. Also watch out for sudden changes in feelings such as feeling anxious, agitated, panicky, irritable, hostile, aggressive, impulsive, severely restless, overly excited and hyperactive, or not being able to sleep. If this happens, especially at the beginning of treatment or after a change in dose, call your health care professional. Avoid alcoholic drinks while taking this medicine. Drinking excessive alcoholic beverages, using sleeping or anxiety medicines, or quickly stopping the use of these agents while taking this medicine may increase your risk  for a seizure. Do not drive or use heavy machinery until you know how this medicine affects you. This medicine can impair your ability to perform these tasks. Do not take this medicine close to bedtime. It may prevent you from sleeping. Your mouth may get dry. Chewing sugarless gum or sucking hard candy, and drinking plenty of water may help. Contact your doctor if the problem does not go away or is severe. What side effects may I notice from receiving this medicine? Side effects that you should report to your doctor or health care professional as soon as possible: -allergic reactions like skin rash, itching or hives, swelling of the face, lips, or tongue -breathing problems -changes in vision -confusion -fast or irregular heartbeat -hallucinations -increased blood pressure -redness, blistering, peeling or loosening of the skin, including inside the mouth -seizures -suicidal thoughts or other mood changes -unusually weak or tired -vomiting Side effects that usually do not require medical attention (report to your doctor or health care professional if they continue or are bothersome): -change in sex drive or performance -constipation -headache -loss of appetite -nausea -tremors -weight loss This list may not describe all possible side effects. Call your doctor for medical advice about side effects. You may report side effects to FDA at 1-800-FDA-1088. Where should I keep my medicine? Keep out of the reach of children. Store at room temperature between 20 and 25 degrees C (68 and 77 degrees F), away from direct sunlight and moisture. Keep tightly closed. Throw away any unused medicine after the expiration date. NOTE: This sheet is a summary. It may not cover all  possible information. If you have questions about this medicine, talk to your doctor, pharmacist, or health care provider.    2016, Elsevier/Gold Standard. (2012-10-18 12:41:10)

## 2015-10-04 NOTE — Telephone Encounter (Signed)
Patient has appt today.

## 2015-10-04 NOTE — Progress Notes (Signed)
[facility]    Provider:  Dr Lucia GaskinsAhern Referring Provider: Assunta FoundGolding, John, MD [facility]  [facility]  CC: Migraine  Interval history 10/04/2015:  Here for follow up of migraines. She never had her sleep eval, discussed untreated sleep apnea can cause increased risk of stroke and other sequelae. Her migraines are feeling better. She is terrified that she has cancer, she has been smoking so long, she wants to quit. Discussed at length. Will try her on Wellbutrin. Recommended hypnotist who performs this for smoking. Also recommended therapy for smoking cessation. Discussed other medications and side effects, chantix for example. Decided on Wellbutrin. Discussed side effects as detailed in the patient instructions. Asked her to use my chart to keep me informed of her progress. She clenches her jaw at night, may be stress, has seen a dentist and can't afford the mouthguard.   Interval History 10/01/2014: Ardeen FillersDawn R Aldredge is a 60 y.o. female here as a follow up for migraine. She is transitioning to my care.She is waking up with headachesand thinks she is clenching her teeth or snoring. She is going to the dentist to see if teeth clenching is causing her headaches. She feels lightheaded and some tingling every once in a while but otherwise no side effects from the Topiramate. She takes one Topiramate the morning and 2 at night. Relpax helps with acute management. Snores at night. Excessive daytime sleepiness. Morning headache. ESS 14.  HISTORY OF PRESENT ILLNESS:  Interval History 03/27/2014: When she is not drinking enough water she gets burning urination. She is not sleeping well. She used to take Xanax. She is not getting any migraines. Has some tingling in the fingers occasionally but otherwise doing well. She falls asleep but she wakes up at midnight and can't go to sleep for hours. Doesn't know why she is waking up, possibly to urinate then can't go  back to sleep. She used to take Xanax every night but got off of it. She is not a good sleeper.   09/29/13 (LL): Since last visit 6 months ago, headaches have picked up again in frequency as weather has gotten hotter. Side effects of TPX diminished. She is having more frequent headaches that have a more tight, pressure quality, but not many severe headaches. Overall much better than before going on TPX. She has not had great response from Sumatriptan; always needing second dose and not fully relieving headache. Her daughter has had good response from Relpax.  03/10/13 (LL): Patient returns for 2 month follow up for Migraines, they have greatly improved on TPX. She is having some tingling in fingers and occasional facial numbness, balance seems off, nausea first few weeks (resolved). She is happy not to have severe headache though. Still having 1-2 milder headaches, treating with ibuprofen. She has not had to use Sumatriptan. She is happy with current treatment.   01/07/13 (VP): 60 year old right-handed female here for a evaluation of headaches, recurrent since 2012, but worsened since July 2014. Patient was in her 6220s, she had developed right-sided headaches with photophobia, pulsating and throbbing sensation. She took over-the-counter medications with good relief. However, she was not officially diagnosed with migraine. By late 30s and 40s, she had stopped having headaches. 2012 headaches returned. Since summer 2014, headaches have been significantly worsen. She had recurrent right-sided throbbing severe headaches with retro-orbital pain, nausea, photophobia. She was sensitive to smells. She's been taking Excedrin Migraine, Benadryl, Motrin, Tylenol as a for headaches. Patient went to the emergency room  one time because of severe headaches, received headache cocktails and CT scan head which are unremarkable. Since then she's had lingering daily headaches. She has 2-3 severe headaches per week. Strong  family history of migraine headaches in patient's sister, daughter, maternal great aunt. Patient is never tried anti-migraine medications in the past. No specific triggering factors  Review of Systems: Patient complains of symptoms per HPI as well as the following symptoms: eye itching, allergies, frequent waking, daytime sleepiess. Pertinent negatives per HPI. All others negative.   Social History   Social History  . Marital Status: Widowed    Spouse Name: N/A  . Number of Children: 1  . Years of Education: BS   Occupational History  . Retired     Runner, broadcasting/film/videoTeacher   Social History Main Topics  . Smoking status: Current Some Day Smoker -- 0.50 packs/day    Types: Cigarettes  . Smokeless tobacco: Never Used  . Alcohol Use: Yes     Comment: Rare; 1 glass of wine  . Drug Use: No  . Sexual Activity: No   Other Topics Concern  . Not on file   Social History Narrative   Patient lives at home alone.   Caffeine Use: 1 soda and 2 cups of coffee daily    Family History  Problem Relation Age of Onset  . Hypertension Mother   . Heart disease Father   . Stroke Maternal Grandmother   . Heart disease Paternal Grandmother   . Cancer Paternal Grandfather     Stomach  . Migraines Sister   . Migraines Maternal Aunt     great aunt  . Migraines Daughter     Past Medical History  Diagnosis Date  . IBS (irritable bowel syndrome)   . Colon polyps   . Arthritis     Past Surgical History  Procedure Laterality Date  . Cholecystectomy  1997  . Cesarean section  1991  . Shoulder surgery      Current Outpatient Prescriptions  Medication Sig Dispense Refill  . Acetaminophen (TYLENOL PO) Take by mouth as needed.    . ALPRAZolam (XANAX) 0.25 MG tablet Take 1 tablet (0.25 mg total) by mouth at bedtime as needed for sleep. 15 tablet 4  . aspirin 81 MG tablet Take 81 mg by mouth daily.    Marland Kitchen. eletriptan (RELPAX) 40 MG tablet Take 1 tablet (40 mg total) by mouth once. One tablet by mouth at onset  of headache. May repeat in 2 hours if headache persists or recurs. 10 tablet 6  . Ibuprofen (MOTRIN PO) Take by mouth as needed.    . topiramate (TOPAMAX) 25 MG tablet Take 1 tablet in the morning and 2 tablets at night. 90 tablet 6  . cyclobenzaprine (FLEXERIL) 10 MG tablet Take 1 tablet (10 mg total) by mouth at bedtime. (Patient not taking: Reported on 10/04/2015) 90 tablet 6   No current facility-administered medications for this visit.    Allergies as of 10/04/2015 - Review Complete 10/04/2015  Allergen Reaction Noted  . Celecoxib Hives 10/01/2014  . Codeine Nausea And Vomiting 08/30/2012  . Morphine Other (See Comments) 10/01/2014  . Sumatriptan Other (See Comments) 10/01/2014  . Sulfa antibiotics Rash and Hives 08/29/2012    Vitals: Ht 5\' 1"  (1.549 m)  Wt 133 lb 6.4 oz (60.51 kg)  BMI 25.22 kg/m2 Last Weight:  Wt Readings from Last 1 Encounters:  10/04/15 133 lb 6.4 oz (60.51 kg)   Last Height:   Ht Readings from Last 1  Encounters:  10/04/15  (1.549 m)     Cognition:  The patient is oriented to person, place, and time;   recent and remote memory intact;   language fluent;   normal attention, concentration,   fund of knowledge Cranial Nerves:  The pupils are equal, round, and reactive to light. The fundi are normal and spontaneous venous pulsations are present. Visual fields are full to finger confrontation. Extraocular movements are intact. Trigeminal sensation is intact and the muscles of mastication are normal. The face is symmetric. The palate elevates in the midline. Voice is normal. Shoulder shrug is normal. The tongue has normal motion without fasciculations.   Coordination:  Normal finger to nose and heel to shin. Normal rapid alternating movements.   Gait:  Heel-toe and tandem gait are normal.   Motor Observation:  No asymmetry, no atrophy, and no involuntary movements noted. Tone:  Normal muscle tone.   Posture:   Posture is normal. normal erect   Strength:  Strength is V/V in the upper and lower limbs.    Sensation: intact   Reflex Exam:  DTR's:  Deep tendon reflexes in the upper and lower extremities are normal bilaterally.  Toes:  The toes are downgoing bilaterally.  Clonus:  Clonus is absent.    ASSESSMENT AND PLAN 60 y.o. year old female here for f/up of headache. She appears well controlled on Topamax and Relpax. Wants to quit smoking. Has anxiety and fearful she has cancer because she has smoked for so long.   Remember to drink plenty of fluid, eat healthy meals and do not skip any meals. Try to eat protein with a every meal and eat a healthy snack such as fruit or nuts in between meals. Try to keep a regular sleep-wake schedule and try to exercise daily, particularly in the form of walking, 20-30 minutes a day, if you can.   As far as your medications are concerned, I would like to suggest: Wellbutrin SR  for 3 days then increase to  twice daily Take Wellbutrin for 5-7 days then stop smoking Xanax as needed for sleep or anxiety Continue the Toapamax for migraine Flexeril at night for jaw clenching  Silas Flood Hypnotist, for smoking, [phone] $300 for 3 sessions,   Naomie Dean, MD  [facility] [address] [place], [place] [address]  Phone [phone] Fax [phone]  A total of 30 minutes was spent face-to-face with this patient. Over half this time was spent on counseling patient on the migraine, smoking cessation, jaw clenching, anxiety diagnosis and different diagnostic and therapeutic options available.

## 2015-10-08 ENCOUNTER — Telehealth: Payer: Self-pay | Admitting: Neurology

## 2015-10-11 NOTE — Telephone Encounter (Signed)
Called pt. Advised rx sent to pharmacy as requested. She verbalized understanding.

## 2015-10-11 NOTE — Telephone Encounter (Signed)
Patient is [facility] make [facility] [address] [place], Thanks!

## 2015-11-17 ENCOUNTER — Telehealth: Payer: Self-pay | Admitting: *Deleted

## 2015-11-17 NOTE — Telephone Encounter (Signed)
Dr Lucia GaskinsAhernLorain Trevino- FYI  Patient called office back. She did pick up rx after Dr Lucia GaskinsAhern prescribed, however she stated that she did not start medication until last week of July, where she was taking only 1 tablet. She started taking 2 tablets August 2nd. She has been smoke free for the past week now. She states she thinks medication is keeping her awake at night but she wanted to give it some more time to adjust and see how she does. Advised I will let Dr Lucia GaskinsAhern know. Advised her to call if she has any further questions or concerns.

## 2015-11-17 NOTE — Telephone Encounter (Signed)
Called and LVM for pt to call. Gave [facility] phone number.   I received a notice that patient has not been taking wellbutrin. I called pt pharmacy, [facility] and spoke to pharmacist. She stated pt last picked up rx wellbutrin on 6/30 and has not picked up any more refills.

## 2016-10-03 ENCOUNTER — Encounter: Payer: Self-pay | Admitting: Neurology

## 2016-10-03 ENCOUNTER — Encounter (INDEPENDENT_AMBULATORY_CARE_PROVIDER_SITE_OTHER): Payer: Self-pay

## 2016-10-03 ENCOUNTER — Ambulatory Visit (INDEPENDENT_AMBULATORY_CARE_PROVIDER_SITE_OTHER): Payer: BC Managed Care – PPO | Admitting: Neurology

## 2016-10-03 VITALS — BP 112/71 | HR 78 | Ht 60.5 in | Wt 138.0 lb

## 2016-10-03 DIAGNOSIS — Z716 Tobacco abuse counseling: Secondary | ICD-10-CM

## 2016-10-03 DIAGNOSIS — Z720 Tobacco use: Secondary | ICD-10-CM | POA: Diagnosis not present

## 2016-10-03 DIAGNOSIS — G43009 Migraine without aura, not intractable, without status migrainosus: Secondary | ICD-10-CM

## 2016-10-03 MED ORDER — ALPRAZOLAM 0.25 MG PO TABS: 0.2500 mg | ORAL_TABLET | Freq: Every evening | ORAL | 5 refills | Status: DC | PRN

## 2016-10-03 MED ORDER — BUPROPION HCL ER (SR) 150 MG PO TB12: 150.0000 mg | ORAL_TABLET | Freq: Two times a day (BID) | ORAL | 12 refills | Status: DC

## 2016-10-03 MED ORDER — ELETRIPTAN HYDROBROMIDE 40 MG PO TABS: ORAL_TABLET | ORAL | 11 refills | Status: DC

## 2016-10-03 MED ORDER — CYCLOBENZAPRINE HCL 10 MG PO TABS: 10.0000 mg | ORAL_TABLET | Freq: Every day | ORAL | 12 refills | Status: DC

## 2016-10-03 MED ORDER — TOPIRAMATE 25 MG PO TABS: ORAL_TABLET | ORAL | 12 refills | Status: DC

## 2016-10-03 NOTE — Addendum Note (Signed)
Addended by: Naomie DeanAHERN, Shamiya Demeritt B on: 10/03/2016 12:10 PM   Modules accepted: Orders

## 2016-10-03 NOTE — Patient Instructions (Signed)
Remember to drink plenty of fluid, eat healthy meals and do not skip any meals. Try to eat protein with a every meal and eat a healthy snack such as fruit or nuts in between meals. Try to keep a regular sleep-wake schedule and try to exercise daily, particularly in the form of walking, 20-30 minutes a day, if you can.   As far as your medications are concerned, I would like to suggest: Continue current medications  I would like to see you back in 6 months, sooner if we need to. Please call us with any interim questions, concerns, problems, updates or refill requests.   Our phone number is [phone]. We also have an after hours call service for urgent matters and there is a physician on-call for urgent questions. For any emergencies you know to call 911 or go to the nearest emergency room   

## 2016-10-03 NOTE — Progress Notes (Signed)
[facility]   Provider:  Dr Lucia Gaskins Referring Provider: Assunta Found, MD Primary Care Physician:  [facility]  CC: Migraine  Interval history 10/03/2013: Patient is here for yearly follow-up of migraines. She has done well on Topamax, Relpax, Flexeril. Recommend sleep eval but she never complied. Had a long discussion about smoking cessation, started her on Wellbutrin and recommended hypnotist.  Not much has changed. She went for 8 months without smoking, she had a relapse in April but quit in the last week and back on the wellbutrin. Worse in the spring. She has hadaches often during the month little mild headache and severe migraines are once every 2 months but monthly 4 migraines but needs . Relpax helps when she takes it for migraines. Takes tylenol occasionally, she really don;t keep track. Asked her to keep a migraine diary. 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  Interval history 10/04/2015:  Here for follow up of migraines. She never had her sleep eval, discussed untreated sleep apnea can cause increased risk of stroke and other sequelae. Her migraines are feeling better. She is terrified that she has cancer, she has been smoking so long, she wants to quit. Discussed at length. Will try her on Wellbutrin. Recommended hypnotist who performs this for smoking. Also recommended therapy for smoking cessation. Discussed other medications and side effects, chantix for example. Decided on Wellbutrin. Discussed side effects as detailed in the patient instructions. Asked her to use my chart to keep me informed of her progress. She clenches her jaw at night, may be stress, has seen a dentist and can't afford the mouthguard.   Interval History 10/01/2014: ZAIN BINGMAN is a 61 y.o. female here as a follow up for migraine. She is transitioning to my  care.She is waking up with headachesand thinks she is clenching her teeth or snoring. She is going to the dentist to see if teeth clenching is causing her headaches. She feels lightheaded and some tingling every once in a while but otherwise no side effects from the Topiramate. She takes one Topiramate the morning and 2 at night. Relpax helps with acute management. Snores at night. Excessive daytime sleepiness. Morning headache. ESS 14.  Interval History 03/27/2014: When she is not drinking enough water she gets burning urination. She is not sleeping well. She used to take Xanax. She is not getting any migraines. Has some tingling in the fingers occasionally but otherwise doing well. She falls asleep but she wakes up at midnight and can't go to sleep for hours. Doesn't know why she is waking up, possibly to urinate then can't go back to sleep. She used to take Xanax every night but got off of it. She is not a good sleeper.   09/29/13 (LL): Since last visit 6 months ago, headaches have picked up again in frequency as weather has gotten hotter. Side effects of TPX diminished. She is having more frequent headaches that have a more tight, pressure quality, but not many severe headaches. Overall much better than before going on TPX. She has not had great response from Sumatriptan; always needing second dose and not fully relieving headache. Her daughter has had good response from Relpax.  03/10/13 (LL): Patient returns for 2 month follow up for Migraines, they have greatly improved on TPX. She is having some tingling in fingers and occasional facial numbness, balance seems off,  nausea first few weeks (resolved). She is happy not to have severe headache though. Still having 1-2 milder headaches, treating with ibuprofen. She has not had to use Sumatriptan. She is happy with current treatment.   01/07/13 (VP): 61 year old right-handed female here for a evaluation of headaches, recurrent since 2012, but  worsened since July 2014. Patient was in her 81s, she had developed right-sided headaches with photophobia, pulsating and throbbing sensation. She took over-the-counter medications with good relief. However, she was not officially diagnosed with migraine. By late 30s and 40s, she had stopped having headaches. 2012 headaches returned. Since summer 2014, headaches have been significantly worsen. She had recurrent right-sided throbbing severe headaches with retro-orbital pain, nausea, photophobia. She was sensitive to smells. She's been taking Excedrin Migraine, Benadryl, Motrin, Tylenol as a for headaches. Patient went to the emergency room one time because of severe headaches, received headache cocktails and CT scan head which are unremarkable. Since then she's had lingering daily headaches. She has 2-3 severe headaches per week. Strong family history of migraine headaches in patient's sister, daughter, maternal great aunt. Patient is never tried anti-migraine medications in the past. No specific triggering factors  Review of Systems: Patient complains of symptoms per HPI as well as the following symptoms: Snoring, daytime sleepiness. Pertinent negatives per HPI. All others negative.   Social History   Social History  . Marital status: Widowed    Spouse name: N/A  . Number of children: 1  . Years of education: BS   Occupational History  . Retired     Runner, broadcasting/film/video   Social History Main Topics  . Smoking status: Current Some Day Smoker    Packs/day: 0.50    Types: Cigarettes  . Smokeless tobacco: Never Used  . Alcohol use Yes     Comment: Rare; 1 glass of wine  . Drug use: No  . Sexual activity: No   Other Topics Concern  . Not on file   Social History Narrative   Patient lives at home alone.   Caffeine Use: 1 soda and 2 cups of coffee daily    Family History  Problem Relation Age of Onset  . Hypertension Mother   . Heart disease Father   . Stroke Maternal Grandmother   . Heart  disease Paternal Grandmother   . Cancer Paternal Grandfather        Stomach  . Migraines Sister   . Migraines Maternal Aunt        great aunt  . Migraines Daughter     Past Medical History:  Diagnosis Date  . Arthritis   . Colon polyps   . IBS (irritable bowel syndrome)     Past Surgical History:  Procedure Laterality Date  . CESAREAN SECTION  1991  . CHOLECYSTECTOMY  1997  . SHOULDER SURGERY      Current Outpatient Prescriptions  Medication Sig Dispense Refill  . Acetaminophen (TYLENOL PO) Take by mouth as needed.    . ALPRAZolam (XANAX) 0.25 MG tablet Take 1 tablet (0.25 mg total) by mouth at bedtime as needed for sleep. 25 tablet 5  . aspirin 81 MG tablet Take 81 mg by mouth daily.    Marland Kitchen buPROPion (WELLBUTRIN SR) 150 MG 12 hr tablet Take 1 tablet (150 mg total) by mouth 2 (two) times daily. 60 tablet 12  . cyclobenzaprine (FLEXERIL) 10 MG tablet Take 1 tablet (10 mg total) by mouth at bedtime. 30 tablet 12  . Ibuprofen (MOTRIN PO) Take by mouth as needed.    Marland Kitchen  RELPAX 40 MG tablet take 1 tablet by mouth AT ONSET OF HEADACHE MAY REPEAT IN 2 HOURS IF HEADACHE RECURS 10 tablet 11  . topiramate (TOPAMAX) 25 MG tablet Take 1 tablet in the morning and 2 tablets at night. 90 tablet 12   No current facility-administered medications for this visit.     Allergies as of 10/03/2016 - Review Complete 10/04/2015  Allergen Reaction Noted  . Celecoxib Hives 10/01/2014  . Codeine Nausea And Vomiting 08/30/2012  . Morphine Other (See Comments) 10/01/2014  . Sumatriptan Other (See Comments) 10/01/2014  . Sulfa antibiotics Rash and Hives 08/29/2012    Vitals: There were no vitals taken for this visit. Last Weight:  Wt Readings from Last 1 Encounters:  10/04/15 133 lb 6.4 oz (60.5 kg)   Last Height:   Ht Readings from Last 1 Encounters:  10/04/15 5\' 1"  (1.549 m)   Physical exam: Exam: Gen: NAD, conversant, well nourised, obese, well groomed                     Eyes:  Conjunctivae clear without exudates or hemorrhage  Neuro: Detailed Neurologic Exam  Speech:    Speech is normal; fluent and spontaneous with normal comprehension.  Cognition:    The patient is oriented to person, place, and time;  Cranial Nerves:    The pupils are equal, round, and reactive to light.  Visual fields are full to finger confrontation. Extraocular movements are intact. Trigeminal sensation is intact and the muscles of mastication are normal. The face is symmetric. The palate elevates in the midline. Hearing intact. Voice is normal. Shoulder shrug is normal. The tongue has normal motion without fasciculations.   Motor Observation:    No asymmetry, no atrophy, and no involuntary movements noted. Tone:    Normal muscle tone.    Posture:    Posture is normal. normal erect    Strength:    Strength is V/V in the upper and lower limbs.      Sensation: intact to LT     Assessment and plan: 61 y.o. year old female here for f/up of headache. She appears well controlled on Topamax and Relpax. Wants to quit smoking. Has anxiety and fearful she has cancer because she has smoked for so long.    Smoking: She relapsed but now has stopped smoking again in the last week and restarted wellbutrin.  Weight gain: due to quitting smoking, she has lost a little now  Flexeril at night helps makes a small impact and she wakes up less tight. Continue.  Topamax has made mouth dry. 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 Offered PT for TMJ she declines, will continue to work with dentist on night guard.   Remember to drink plenty of fluid, eat healthy meals and do not skip any meals. Try to eat protein with a every meal and eat a healthy snack such as fruit or nuts in between meals. Try to keep a regular sleep-wake schedule and try to exercise daily, particularly in the form of walking, 20-30 minutes a day, if you can.   Continue  the Toapamax for migraine Flexeril at night for jaw clenching Discussed [employer]  Silas Flood Hypnotist, for smoking, [phone] $300 for 3 sessions  Naomie Dean, MD  [facility] [address] [place], [place] [address]  Phone [phone] Fax [phone]  A total of 25 minutes was spent face-to-face  with this patient. Over half this time was spent on counseling patient on the migraine diagnosis and different diagnostic and therapeutic options available.

## 2017-02-21 ENCOUNTER — Encounter: Payer: Self-pay | Admitting: Gynecology

## 2017-02-22 ENCOUNTER — Encounter: Payer: Self-pay | Admitting: Gynecology

## 2017-06-08 DIAGNOSIS — A0811 Acute gastroenteropathy due to Norwalk agent: Secondary | ICD-10-CM

## 2017-06-08 HISTORY — DX: Acute gastroenteropathy due to Norwalk agent: A08.11

## 2017-06-25 ENCOUNTER — Telehealth: Payer: Self-pay | Admitting: Neurology

## 2017-06-25 NOTE — Telephone Encounter (Signed)
Pt is calling for a refill on ALPRAZolam (XANAX) 0.25 MG tablet.  Please send to  [phone] - [place], [place] - [address] AT [facility] [address] [phone] (Phone) [phone] (Fax)

## 2017-06-26 MED ORDER — ALPRAZOLAM 0.25 MG PO TABS: 0.2500 mg | ORAL_TABLET | Freq: Every evening | ORAL | 5 refills | Status: DC | PRN

## 2017-06-26 NOTE — Telephone Encounter (Addendum)
Prescription printed. Included note that appt needed. Registry checked.

## 2017-06-26 NOTE — Telephone Encounter (Signed)
I can refill up to 10 a month which I think is what I originally gave her thanks

## 2017-06-26 NOTE — Telephone Encounter (Signed)
Faxed Xanax prescription to pharmacy. Received a receipt of confirmation.  

## 2017-06-26 NOTE — Addendum Note (Signed)
Addended by: Bertram SavinULBERTSON, BETHANY L on: 06/26/2017 01:15 PM   Modules accepted: Orders

## 2017-06-29 ENCOUNTER — Inpatient Hospital Stay (HOSPITAL_COMMUNITY): Payer: BC Managed Care – PPO

## 2017-06-29 ENCOUNTER — Observation Stay (HOSPITAL_COMMUNITY)
Admission: EM | Admit: 2017-06-29 | Discharge: 2017-06-30 | DRG: 392 | Disposition: A | Discharge status: Home / Self Care | Payer: BC Managed Care – PPO | Attending: Internal Medicine | Admitting: Internal Medicine

## 2017-06-29 ENCOUNTER — Other Ambulatory Visit: Payer: Self-pay

## 2017-06-29 ENCOUNTER — Encounter (HOSPITAL_COMMUNITY): Payer: Self-pay | Admitting: Emergency Medicine

## 2017-06-29 DIAGNOSIS — F418 Other specified anxiety disorders: Secondary | ICD-10-CM | POA: Diagnosis present

## 2017-06-29 DIAGNOSIS — N179 Acute kidney failure, unspecified: Secondary | ICD-10-CM | POA: Diagnosis present

## 2017-06-29 DIAGNOSIS — A0811 Acute gastroenteropathy due to Norwalk agent: Principal | ICD-10-CM | POA: Diagnosis present

## 2017-06-29 DIAGNOSIS — Z888 Allergy status to other drugs, medicaments and biological substances status: Secondary | ICD-10-CM

## 2017-06-29 DIAGNOSIS — G43009 Migraine without aura, not intractable, without status migrainosus: Secondary | ICD-10-CM | POA: Diagnosis not present

## 2017-06-29 DIAGNOSIS — Z882 Allergy status to sulfonamides status: Secondary | ICD-10-CM

## 2017-06-29 DIAGNOSIS — Z885 Allergy status to narcotic agent status: Secondary | ICD-10-CM

## 2017-06-29 DIAGNOSIS — R112 Nausea with vomiting, unspecified: Secondary | ICD-10-CM

## 2017-06-29 DIAGNOSIS — R066 Hiccough: Secondary | ICD-10-CM

## 2017-06-29 DIAGNOSIS — Z7982 Long term (current) use of aspirin: Secondary | ICD-10-CM

## 2017-06-29 DIAGNOSIS — E872 Acidosis, unspecified: Secondary | ICD-10-CM

## 2017-06-29 DIAGNOSIS — K589 Irritable bowel syndrome without diarrhea: Secondary | ICD-10-CM | POA: Diagnosis present

## 2017-06-29 DIAGNOSIS — B9620 Unspecified Escherichia coli [E. coli] as the cause of diseases classified elsewhere: Secondary | ICD-10-CM | POA: Diagnosis not present

## 2017-06-29 DIAGNOSIS — R197 Diarrhea, unspecified: Secondary | ICD-10-CM | POA: Diagnosis not present

## 2017-06-29 DIAGNOSIS — E860 Dehydration: Secondary | ICD-10-CM

## 2017-06-29 DIAGNOSIS — G43709 Chronic migraine without aura, not intractable, without status migrainosus: Secondary | ICD-10-CM | POA: Diagnosis present

## 2017-06-29 DIAGNOSIS — E876 Hypokalemia: Secondary | ICD-10-CM | POA: Diagnosis not present

## 2017-06-29 DIAGNOSIS — Z79899 Other long term (current) drug therapy: Secondary | ICD-10-CM | POA: Diagnosis not present

## 2017-06-29 DIAGNOSIS — Z87891 Personal history of nicotine dependence: Secondary | ICD-10-CM | POA: Diagnosis not present

## 2017-06-29 DIAGNOSIS — R1110 Vomiting, unspecified: Secondary | ICD-10-CM

## 2017-06-29 DIAGNOSIS — A084 Viral intestinal infection, unspecified: Secondary | ICD-10-CM | POA: Diagnosis present

## 2017-06-29 LAB — BASIC METABOLIC PANEL
ANION GAP: 9 (ref 5–15)
BUN: 39 mg/dL — ABNORMAL HIGH (ref 6–20)
CHLORIDE: 115 mmol/L — AB (ref 101–111)
CO2: 17 mmol/L — AB (ref 22–32)
Calcium: 7.8 mg/dL — ABNORMAL LOW (ref 8.9–10.3)
Creatinine, Ser: 1.36 mg/dL — ABNORMAL HIGH (ref 0.44–1.00)
GFR calc non Af Amer: 41 mL/min — ABNORMAL LOW (ref >60)
GFR, EST AFRICAN AMERICAN: 48 mL/min — AB (ref >60)
Glucose, Bld: 104 mg/dL — ABNORMAL HIGH (ref 65–99)
POTASSIUM: 3.3 mmol/L — AB (ref 3.5–5.1)
Sodium: 141 mmol/L (ref 135–145)

## 2017-06-29 LAB — COMPREHENSIVE METABOLIC PANEL
ALT: 26 U/L (ref 14–54)
ANION GAP: 19 — AB (ref 5–15)
AST: 31 U/L (ref 15–41)
Albumin: 4.4 g/dL (ref 3.5–5.0)
Alkaline Phosphatase: 70 U/L (ref 38–126)
BILIRUBIN TOTAL: 0.7 mg/dL (ref 0.3–1.2)
BUN: 58 mg/dL — AB (ref 6–20)
CO2: 17 mmol/L — ABNORMAL LOW (ref 22–32)
Calcium: 9 mg/dL (ref 8.9–10.3)
Chloride: 100 mmol/L — ABNORMAL LOW (ref 101–111)
Creatinine, Ser: 3.38 mg/dL — ABNORMAL HIGH (ref 0.44–1.00)
GFR, EST AFRICAN AMERICAN: 16 mL/min — AB (ref >60)
GFR, EST NON AFRICAN AMERICAN: 14 mL/min — AB (ref >60)
Glucose, Bld: 131 mg/dL — ABNORMAL HIGH (ref 65–99)
POTASSIUM: 3.3 mmol/L — AB (ref 3.5–5.1)
Sodium: 136 mmol/L (ref 135–145)
TOTAL PROTEIN: 7.9 g/dL (ref 6.5–8.1)

## 2017-06-29 LAB — URINALYSIS, ROUTINE W REFLEX MICROSCOPIC
Bilirubin Urine: NEGATIVE
Glucose, UA: NEGATIVE mg/dL
KETONES UR: NEGATIVE mg/dL
LEUKOCYTES UA: NEGATIVE
NITRITE: NEGATIVE
PH: 5 (ref 5.0–8.0)
Protein, ur: 30 mg/dL — AB
Specific Gravity, Urine: 1.017 (ref 1.005–1.030)

## 2017-06-29 LAB — CBC
HEMATOCRIT: 46 % (ref 36.0–46.0)
HEMOGLOBIN: 16 g/dL — AB (ref 12.0–15.0)
MCH: 30.4 pg (ref 26.0–34.0)
MCHC: 34.8 g/dL (ref 30.0–36.0)
MCV: 87.5 fL (ref 78.0–100.0)
Platelets: 202 10*3/uL (ref 150–400)
RBC: 5.26 MIL/uL — AB (ref 3.87–5.11)
RDW: 13.5 % (ref 11.5–15.5)
WBC: 11.5 10*3/uL — AB (ref 4.0–10.5)

## 2017-06-29 LAB — C DIFFICILE QUICK SCREEN W PCR REFLEX
C DIFFICILE (CDIFF) INTERP: NOT DETECTED
C DIFFICILE (CDIFF) TOXIN: NEGATIVE
C Diff antigen: NEGATIVE

## 2017-06-29 LAB — MAGNESIUM: Magnesium: 1.8 mg/dL (ref 1.7–2.4)

## 2017-06-29 LAB — LACTIC ACID, PLASMA
Lactic Acid, Venous: 1.1 mmol/L (ref 0.5–1.9)
Lactic Acid, Venous: 1.5 mmol/L (ref 0.5–1.9)

## 2017-06-29 LAB — SODIUM, URINE, RANDOM: Sodium, Ur: 14 mmol/L

## 2017-06-29 LAB — LIPASE, BLOOD: Lipase: 21 U/L (ref 11–51)

## 2017-06-29 LAB — CREATININE, URINE, RANDOM: CREATININE, URINE: 224.38 mg/dL

## 2017-06-29 LAB — POC OCCULT BLOOD, ED: FECAL OCCULT BLD: POSITIVE — AB

## 2017-06-29 MED ORDER — ACETAMINOPHEN 325 MG PO TABS: 650.0000 mg | ORAL_TABLET | Freq: Four times a day (QID) | ORAL | Status: DC | PRN

## 2017-06-29 MED ORDER — SODIUM CHLORIDE 0.9 % IV BOLUS (SEPSIS)
1000.0000 mL | Freq: Once | INTRAVENOUS | Status: AC
  Administered 2017-06-29: 1000 mL via INTRAVENOUS

## 2017-06-29 MED ORDER — TOPIRAMATE 25 MG PO TABS
50.0000 mg | ORAL_TABLET | Freq: Every day | ORAL | Status: DC
  Administered 2017-06-29: 50 mg via ORAL
  Filled 2017-06-29: qty 2

## 2017-06-29 MED ORDER — PROMETHAZINE HCL 25 MG/ML IJ SOLN: 12.5000 mg | Freq: Four times a day (QID) | INTRAMUSCULAR | Status: DC | PRN

## 2017-06-29 MED ORDER — CYCLOBENZAPRINE HCL 10 MG PO TABS
10.0000 mg | ORAL_TABLET | Freq: Every day | ORAL | Status: DC
  Administered 2017-06-29: 10 mg via ORAL
  Filled 2017-06-29: qty 1

## 2017-06-29 MED ORDER — ONDANSETRON HCL 4 MG PO TABS: 4.0000 mg | ORAL_TABLET | Freq: Four times a day (QID) | ORAL | Status: DC | PRN

## 2017-06-29 MED ORDER — CHLORPROMAZINE HCL 25 MG/ML IJ SOLN
25.0000 mg | Freq: Once | INTRAMUSCULAR | Status: AC
  Administered 2017-06-29: 25 mg via INTRAMUSCULAR
  Filled 2017-06-29: qty 1

## 2017-06-29 MED ORDER — TOPIRAMATE 25 MG PO TABS
25.0000 mg | ORAL_TABLET | Freq: Every day | ORAL | Status: DC
  Administered 2017-06-29 – 2017-06-30 (×2): 25 mg via ORAL
  Filled 2017-06-29 (×2): qty 1

## 2017-06-29 MED ORDER — BUPROPION HCL ER (SR) 150 MG PO TB12
150.0000 mg | ORAL_TABLET | Freq: Two times a day (BID) | ORAL | Status: DC
  Administered 2017-06-29 – 2017-06-30 (×3): 150 mg via ORAL
  Filled 2017-06-29 (×3): qty 1

## 2017-06-29 MED ORDER — POTASSIUM CHLORIDE IN NACL 40-0.9 MEQ/L-% IV SOLN
INTRAVENOUS | Status: AC
  Administered 2017-06-29: 115 mL/h via INTRAVENOUS
  Filled 2017-06-29 (×2): qty 1000

## 2017-06-29 MED ORDER — ACETAMINOPHEN 650 MG RE SUPP: 650.0000 mg | Freq: Four times a day (QID) | RECTAL | Status: DC | PRN

## 2017-06-29 MED ORDER — POTASSIUM CHLORIDE 2 MEQ/ML IV SOLN
INTRAVENOUS | Status: DC
  Administered 2017-06-29 – 2017-06-30 (×2): via INTRAVENOUS
  Filled 2017-06-29 (×7): qty 1000

## 2017-06-29 MED ORDER — ONDANSETRON HCL 4 MG/2ML IJ SOLN
4.0000 mg | Freq: Once | INTRAMUSCULAR | Status: AC | PRN
  Administered 2017-06-29: 4 mg via INTRAVENOUS
  Filled 2017-06-29: qty 2

## 2017-06-29 MED ORDER — ONDANSETRON HCL 4 MG/2ML IJ SOLN: 4.0000 mg | Freq: Four times a day (QID) | INTRAMUSCULAR | Status: DC | PRN

## 2017-06-29 MED ORDER — ALPRAZOLAM 0.5 MG PO TABS
0.2500 mg | ORAL_TABLET | Freq: Every evening | ORAL | Status: DC | PRN
Start: 2017-06-29 — End: 2017-06-30

## 2017-06-29 MED ORDER — POTASSIUM CHLORIDE CRYS ER 20 MEQ PO TBCR
40.0000 meq | EXTENDED_RELEASE_TABLET | Freq: Once | ORAL | Status: AC
  Administered 2017-06-29: 40 meq via ORAL
  Filled 2017-06-29: qty 2

## 2017-06-29 MED ORDER — FAMOTIDINE IN NACL 20-0.9 MG/50ML-% IV SOLN
20.0000 mg | Freq: Two times a day (BID) | INTRAVENOUS | Status: DC
Start: 2017-06-29 — End: 2017-06-30
  Administered 2017-06-29 – 2017-06-30 (×3): 20 mg via INTRAVENOUS
  Filled 2017-06-29 (×4): qty 50

## 2017-06-29 NOTE — ED Provider Notes (Signed)
[facility] EMERGENCY DEPARTMENT Provider Note   CSN: [id number] Arrival date & time: 06/29/17  0130  Time seen 02:35 AM   History   Chief Complaint Chief Complaint  Patient presents with  . Influenza    HPI Paula Trevino is a 62 y.o. female.  HPI patient reports acute onset on March 20 vomiting and diarrhea.  She states initially the diarrhea was very watery.  She states today, March 21 she had for 5 episodes before noon and after that only small amounts.  She has had multiple episodes of vomiting and states she has vomited somewhere between 10-15 times today.  She states she did eat to red popsicles today and the last time she vomited she saw some bright red in the vomitus bag.  She is not sure if it is blood.  She states she has had some crampy lower abdominal pain that comes and goes.  She is unsure of fever but has had chills.  She also complains of myalgias.  She states "my esophagus is not right".  She states it hurts and she feels like it is from all the vomiting.  She states a lot of the vomitus has a lot of acid in it.  She is not currently on medicine for GERD.  She denies cough or sore throat.  She denies feeling dizzy or lightheaded.  She states today she had loss of energy and stayed in bed all day.  She states her mouth feels dry and she is very thirsty.  She states she has had minimal urinary output today.  Patient started having hiccuping right before I entered the room.  [employer], [employer] research officer, government   Past Medical History:  Diagnosis Date  . Arthritis   . Colon polyps   . IBS (irritable bowel syndrome)     Patient Active Problem List   Diagnosis Date Noted  . Nausea vomiting and diarrhea 06/29/2017  . Acute kidney injury (HCC) 06/29/2017  . Hypokalemia 06/29/2017  . Smoker 10/04/2015  . Insomnia 10/04/2015  . Migraine without aura 03/10/2013    Past Surgical History:  Procedure Laterality Date  . CESAREAN SECTION  1991  . CHOLECYSTECTOMY  1997  .  SHOULDER SURGERY      OB History    Gravida  1   Para  1   Term  1   Preterm      AB      Living  1     SAB      TAB      Ectopic      Multiple      Live Births               Home Medications    Prior to Admission medications   Medication Sig Start Date End Date Taking? Authorizing Provider  ALPRAZolam (XANAX) 0.25 MG tablet Take 1 tablet (0.25 mg total) by mouth at bedtime as needed for sleep. Please schedule appt 06/26/17  Yes Anson Fret, MD  aspirin 81 MG tablet Take 81 mg by mouth daily.   Yes [provider]  buPROPion (WELLBUTRIN SR) 150 MG 12 hr tablet Take 1 tablet (150 mg total) by mouth 2 (two) times daily. 10/03/16  Yes Anson Fret, MD  cyclobenzaprine (FLEXERIL) 10 MG tablet Take 1 tablet (10 mg total) by mouth at bedtime. 10/03/16  Yes Anson Fret, MD  eletriptan (RELPAX) 40 MG tablet take 1 tablet by mouth AT ONSET OF HEADACHE MAY  REPEAT IN 2 HOURS IF HEADACHE RECURS 10/03/16  Yes Anson Fret, MD  topiramate (TOPAMAX) 25 MG tablet Take 1 tablet in the morning and 2 tablets at night. 10/03/16  Yes Anson Fret, MD  Acetaminophen (TYLENOL PO) Take by mouth as needed.    [provider]  Ibuprofen (MOTRIN PO) Take by mouth as needed.    [provider]    Family History Family History  Problem Relation Age of Onset  . Hypertension Mother   . Heart disease Father   . Stroke Maternal Grandmother   . Heart disease Paternal Grandmother   . Cancer Paternal Grandfather        Stomach  . Migraines Sister   . Migraines Maternal Aunt        great aunt  . Migraines Daughter     Social History Social History   Tobacco Use  . Smoking status: Former Smoker    Packs/day: 0.50    Types: Cigarettes  . Smokeless tobacco: Never Used  Substance Use Topics  . Alcohol use: Yes    Comment: Rare; 1 glass of wine  . Drug use: No  retired Engineer, site, still tutors   Allergies   Celecoxib; Codeine;  Morphine; Sumatriptan; and Sulfa antibiotics   Review of Systems Review of Systems  All other systems reviewed and are negative.    Physical Exam Updated Vital Signs BP 115/76 (BP Location: Left Arm)   Pulse (!) 118   Temp 98 F (36.7 C) (Oral)   Resp 20   Wt 62.6 kg (138 lb)   SpO2 96%   BMI 26.51 kg/m   Vital signs normal except for tachycardia  Physical Exam  Constitutional: She is oriented to person, place, and time. She appears well-developed and well-nourished.  Non-toxic appearance. She does not appear ill. No distress.  HENT:  Head: Normocephalic and atraumatic.  Right Ear: External ear normal.  Left Ear: External ear normal.  Nose: Nose normal. No mucosal edema or rhinorrhea.  Mouth/Throat: Mucous membranes are dry. No dental abscesses or uvula swelling.  Eyes: Pupils are equal, round, and reactive to light. Conjunctivae and EOM are normal.  Neck: Normal range of motion and full passive range of motion without pain. Neck supple.  Cardiovascular: Normal rate, regular rhythm and normal heart sounds. Exam reveals no gallop and no friction rub.  No murmur heard. Pulmonary/Chest: Effort normal and breath sounds normal. No respiratory distress. She has no wheezes. She has no rhonchi. She has no rales. She exhibits no tenderness and no crepitus.  Abdominal: Soft. Normal appearance and bowel sounds are normal. She exhibits no distension. There is no tenderness. There is no rebound and no guarding.  Musculoskeletal: Normal range of motion. She exhibits no edema or tenderness.  Moves all extremities well.   Neurological: She is alert and oriented to person, place, and time. She has normal strength. No cranial nerve deficit.  Skin: Skin is warm, dry and intact. No rash noted. No erythema. There is pallor.  Psychiatric: She has a normal mood and affect. Her speech is normal and behavior is normal. Her mood appears not anxious.  Nursing note and vitals reviewed.    ED  Treatments / Results  Labs (all labs ordered are listed, but only abnormal results are displayed) Results for orders placed or performed during the hospital encounter of 06/29/17  Lipase, blood  Result Value Ref Range   Lipase 21 11 - 51 U/L  Comprehensive metabolic panel  Result  Value Ref Range   Sodium 136 135 - 145 mmol/L   Potassium 3.3 (L) 3.5 - 5.1 mmol/L   Chloride 100 (L) 101 - 111 mmol/L   CO2 17 (L) 22 - 32 mmol/L   Glucose, Bld 131 (H) 65 - 99 mg/dL   BUN 58 (H) 6 - 20 mg/dL   Creatinine, Ser 7.823.38 (H) 0.44 - 1.00 mg/dL   Calcium 9.0 8.9 - 95.610.3 mg/dL   Total Protein 7.9 6.5 - 8.1 g/dL   Albumin 4.4 3.5 - 5.0 g/dL   AST 31 15 - 41 U/L   ALT 26 14 - 54 U/L   Alkaline Phosphatase 70 38 - 126 U/L   Total Bilirubin 0.7 0.3 - 1.2 mg/dL   GFR calc non Af Amer 14 (L) >60 mL/min   GFR calc Af Amer 16 (L) >60 mL/min   Anion gap 19 (H) 5 - 15  CBC  Result Value Ref Range   WBC 11.5 (H) 4.0 - 10.5 K/uL   RBC 5.26 (H) 3.87 - 5.11 MIL/uL   Hemoglobin 16.0 (H) 12.0 - 15.0 g/dL   HCT 21.346.0 08.636.0 - 57.846.0 %   MCV 87.5 78.0 - 100.0 fL   MCH 30.4 26.0 - 34.0 pg   MCHC 34.8 30.0 - 36.0 g/dL   RDW 46.913.5 62.911.5 - 52.815.5 %   Platelets 202 150 - 400 K/uL   Laboratory interpretation all normal except leukocytosis most likely from vomiting, elevated hemoglobin consistent with dehydration, new acute renal insufficiency compared to prior blood work in 2015 when she had a BUN of 19 and creatinine of 0.77, mild hypokalemia    EKG  EKG Interpretation None       Radiology No results found.  Procedures Procedures (including critical care time)  Medications Ordered in ED Medications  chlorproMAZINE (THORAZINE) injection 25 mg (has no administration in time range)  ondansetron (ZOFRAN) injection 4 mg (4 mg Intravenous Given 06/29/17 0211)  sodium chloride 0.9 % bolus 1,000 mL (1,000 mLs Intravenous New Bag/Given 06/29/17 0229)  sodium chloride 0.9 % bolus 1,000 mL (1,000 mLs Intravenous New  Bag/Given 06/29/17 0229)     Initial Impression / Assessment and Plan / ED Course  I have reviewed the triage vital signs and the nursing notes.  Pertinent labs & imaging results that were available during my care of the patient were reviewed by me and considered in my medical decision making (see chart for details).     Patient was given IV fluids for her dehydration and Zofran for her nausea.  She was given Thorazine for her hiccups.  We discussed her test results which show signs of severe dehydration from her vomiting and diarrhea.  We discussed that admission is necessary to preserve her kidney function and she is agreeable.  She was advised if she threw up again and saw the red to let the nursing staff observe it.  Hemoccult was also ordered.  03:19 AM Dr Antionette Charpyd, hospitalist, will admit  Final Clinical Impressions(s) / ED Diagnoses   Final diagnoses:  Vomiting and diarrhea  Dehydration  Hiccups  Acute kidney injury [facility](HCC)  Metabolic acidosis    Plan admission  Devoria AlbeIva Wilburn Keir, MD, [facility]    Emiliana Blaize, MD 06/29/17 [phone]

## 2017-06-29 NOTE — ED Triage Notes (Signed)
Pt states yesterday around 1900 she began to have uncontrollable vomit and diarrhea. Pt states she has taken phenergan with no relief of symptoms. Pt C/O chills.

## 2017-06-29 NOTE — Progress Notes (Signed)
Patient admitted to the hospital earlier this morning by Dr. Antionette Charpyd  Patient seen and examined.  She is feeling better this morning.  No further vomiting.  Stools are starting to form.  No abdominal pain.  Tolerating full liquids.  62 year old female admitted to the hospital with vomiting and diarrhea.  She was noted to be dehydrated and acute kidney injury.  She is improving with IV fluids and supportive management.  C. difficile is negative.  Continue to advance diet as tolerated.  Anticipate discharge in the next 24 hours if she continues to improve.  Darden RestaurantsJehanzeb Ellee Trevino

## 2017-06-29 NOTE — H&P (Signed)
History and Physical    Paula Trevino ZOX:[id number] DOB: 1956/03/27 DOA: 06/29/2017  PCP: [facility]   Patient coming from: Home  Chief Complaint: Vomiting and diarrhea   HPI: Paula Trevino is a 62 y.o. female with medical history significant for depression, anxiety, and chronic migraine, now presenting to the emergency department for evaluation of nausea with vomiting and diarrhea.  Patient reports that she had been in her usual state of health until 06/27/2017 when she developed nausea with nonbloody vomiting and watery diarrhea.  Symptoms have persisted since that time with 10-15 episodes daily.  She denies abdominal pain, denies fevers, and denies melena or hematochezia.  She noted red streaks in her recent vomitus, but reports consuming red ice pops prior to that.  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  She denies using NSAID recently.  Notes that her urine output has decreased but denies dysuria or flank pain.   ED Course: Upon arrival to the ED, patient is found to be afebrile, saturating well on room air, tachycardic to 118, and vitals otherwise normal.  Chemistry panel is notable for potassium 3.3, bicarbonate 17, anion gap 19, BUN 58, and creatinine 3.38, up from 0.77 on the most recent prior from 2015.  CBC is notable for leukocytosis to 11,500 and mild elevation in hemoglobin to 16.  Patient was treated with 2 L normal saline, Zofran, and Thorazine in the emergency department.  Urinalysis and FOBT were ordered and remain pending.  Tachycardia has improved and blood pressure remained stable after the IV fluids.  She will be admitted to the medical-surgical unit for ongoing evaluation and management of profuse vomiting and watery diarrhea with renal failure, likely acute.  Review of Systems:  All other systems reviewed and apart from HPI, are negative.  Past Medical History:   Diagnosis Date  . Arthritis   . Colon polyps   . IBS (irritable bowel syndrome)     Past Surgical History:  Procedure Laterality Date  . CESAREAN SECTION  1991  . CHOLECYSTECTOMY  1997  . SHOULDER SURGERY       reports that she has quit smoking. Her smoking use included cigarettes. She smoked 0.50 packs per day. She has never used smokeless tobacco. She reports that she drinks alcohol. She reports that she does not use drugs.  Allergies  Allergen Reactions  . Celecoxib Hives  . Codeine Nausea And Vomiting  . Morphine Other (See Comments)  . Sumatriptan Other (See Comments)  . Sulfa Antibiotics Rash and Hives    Family History  Problem Relation Age of Onset  . Hypertension Mother   . Heart disease Father   . Stroke Maternal Grandmother   . Heart disease Paternal Grandmother   . Cancer Paternal Grandfather        Stomach  . Migraines Sister   . Migraines Maternal Aunt        great aunt  . Migraines Daughter      Prior to Admission medications   Medication Sig Start Date End Date Taking? Authorizing Provider  ALPRAZolam (XANAX) 0.25 MG tablet Take 1 tablet (0.25 mg total) by mouth at bedtime as needed for sleep. Please schedule appt 06/26/17  Yes Anson Fret, MD  aspirin 81 MG tablet Take 81 mg by mouth daily.   Yes [provider]  buPROPion (WELLBUTRIN SR) 150 MG 12 hr tablet Take 1  tablet (150 mg total) by mouth 2 (two) times daily. 10/03/16  Yes Anson Fret, MD  cyclobenzaprine (FLEXERIL) 10 MG tablet Take 1 tablet (10 mg total) by mouth at bedtime. 10/03/16  Yes Anson Fret, MD  eletriptan (RELPAX) 40 MG tablet take 1 tablet by mouth AT ONSET OF HEADACHE MAY REPEAT IN 2 HOURS IF HEADACHE RECURS 10/03/16  Yes Anson Fret, MD  topiramate (TOPAMAX) 25 MG tablet Take 1 tablet in the morning and 2 tablets at night. 10/03/16  Yes Anson Fret, MD  Acetaminophen (TYLENOL PO) Take by mouth as needed.    [provider]  Ibuprofen  (MOTRIN PO) Take by mouth as needed.    [provider]    Physical Exam: Vitals:   06/29/17 0143 06/29/17 0145  BP: 115/76   Pulse: (!) 118   Resp: 20   Temp: 98 F (36.7 C)   TempSrc: Oral   SpO2: 96%   Weight:  62.6 kg (138 lb)      Constitutional: NAD, calm  Eyes: PERTLA, lids and conjunctivae normal ENMT: Mucous membranes are moist. Posterior pharynx clear of any exudate or lesions.   Neck: normal, supple, no masses, no thyromegaly Respiratory: clear to auscultation bilaterally, no wheezing, no crackles. Normal respiratory effort.    Cardiovascular: Rate ~110 and regular. No extremity edema. No significant JVD. Abdomen: No distension, no tenderness, soft. Bowel sounds active.  Musculoskeletal: no clubbing / cyanosis. No joint deformity upper and lower extremities.    Skin: no significant rashes, lesions, ulcers. Warm, dry, well-perfused. Neurologic: CN 2-12 grossly intact. Sensation intact. Strength 5/5 in all 4 limbs.  Psychiatric: Alert and oriented x 3. Calm, cooperative.     Labs on Admission: I have personally reviewed following labs and imaging studies  CBC: Recent Labs  Lab 06/29/17 0210  WBC 11.5*  HGB 16.0*  HCT 46.0  MCV 87.5  PLT 202   Basic Metabolic Panel: Recent Labs  Lab 06/29/17 0210  NA 136  K 3.3*  CL 100*  CO2 17*  GLUCOSE 131*  BUN 58*  CREATININE 3.38*  CALCIUM 9.0   GFR: CrCl cannot be calculated (Unknown ideal weight.). Liver Function Tests: Recent Labs  Lab 06/29/17 0210  AST 31  ALT 26  ALKPHOS 70  BILITOT 0.7  PROT 7.9  ALBUMIN 4.4   Recent Labs  Lab 06/29/17 0210  LIPASE 21   No results for input(s): AMMONIA in the last 168 hours. Coagulation Profile: No results for input(s): INR, PROTIME in the last 168 hours. Cardiac Enzymes: No results for input(s): CKTOTAL, CKMB, CKMBINDEX, TROPONINI in the last 168 hours. BNP (last 3 results) No results for input(s): PROBNP in the last 8760  hours. HbA1C: No results for input(s): HGBA1C in the last 72 hours. CBG: No results for input(s): GLUCAP in the last 168 hours. Lipid Profile: No results for input(s): CHOL, HDL, LDLCALC, TRIG, CHOLHDL, LDLDIRECT in the last 72 hours. Thyroid Function Tests: No results for input(s): TSH, T4TOTAL, FREET4, T3FREE, THYROIDAB in the last 72 hours. Anemia Panel: No results for input(s): VITAMINB12, FOLATE, FERRITIN, TIBC, IRON, RETICCTPCT in the last 72 hours. Urine analysis:    Component Value Date/Time   COLORURINE YELLOW 08/30/2012 1025   APPEARANCEUR Clear 03/27/2014 1010   LABSPEC 1.007 08/30/2012 1025   PHURINE 7.0 08/30/2012 1025   GLUCOSEU Negative 03/27/2014 1010   HGBUR NEG 08/30/2012 1025   BILIRUBINUR Negative 03/27/2014 1010   KETONESUR NEG 08/30/2012 1025   PROTEINUR  Negative 03/27/2014 1010   PROTEINUR NEG 08/30/2012 1025   UROBILINOGEN 0.2 08/30/2012 1025   NITRITE Negative 03/27/2014 1010   NITRITE NEG 08/30/2012 1025   LEUKOCYTESUR Negative 03/27/2014 1010   Sepsis Labs: @LABRCNTIP (procalcitonin:4,lacticidven:4) )No results found for this or any previous visit (from the past 240 hour(s)).   Radiological Exams on Admission: No results found.  EKG: Not performed.   Assessment/Plan   1. Renal failure  - SCr is 3.38 on admission, up from 0.77 on most recent prior from 2015  - Suspect an acute injury, likely prerenal azotemia in setting of vomiting and diarrhea  - Treated with 2 liters NS in ED  - Check renal US and urine studies, continue IVF hydration, renally-dose medications, avoid nephrotoxins    2. Vomiting, diarrhea  - Viral gastroenteritis seems most likely  - Denies abdominal pain and exam is benign  - She reports red streaks in vomitus, but had been eating red ice pops, FOBT pending  - Continue IVF hydration, monitor and replace lytes, continue supportive care with antiemetics, check stool studies, maintain enteric precautions    3. Hypokalemia   - Serum potassium is 3.3 on admission, secondary to GI-losses  - KCl added to IVF   4. Depression, anxiety  - Continue Wellbutrin and prn Xanax   5. Migraine  - Hx of frequent migraines, followed by neurology  - No headache on admission  - Continue suppressive Topamax     DVT prophylaxis: SCD's  Code Status: Full  Family Communication: Discussed with patient Consults called: None Admission status: Inpatient    Briscoe Deutscherimothy S Takeru Bose, MD [facility] Pager [phone]  If 7PM-7AM, please contact night-coverage [email or web address] Password [email or web address]  06/29/2017, 3:49 AM

## 2017-06-30 DIAGNOSIS — N179 Acute kidney failure, unspecified: Secondary | ICD-10-CM | POA: Diagnosis not present

## 2017-06-30 DIAGNOSIS — E876 Hypokalemia: Secondary | ICD-10-CM | POA: Diagnosis not present

## 2017-06-30 DIAGNOSIS — E872 Acidosis: Secondary | ICD-10-CM | POA: Diagnosis not present

## 2017-06-30 DIAGNOSIS — R197 Diarrhea, unspecified: Secondary | ICD-10-CM

## 2017-06-30 DIAGNOSIS — R1110 Vomiting, unspecified: Secondary | ICD-10-CM

## 2017-06-30 DIAGNOSIS — E860 Dehydration: Secondary | ICD-10-CM | POA: Diagnosis not present

## 2017-06-30 LAB — GASTROINTESTINAL PANEL BY PCR, STOOL (REPLACES STOOL CULTURE)
Adenovirus F40/41: NOT DETECTED
Astrovirus: NOT DETECTED
CAMPYLOBACTER SPECIES: NOT DETECTED
Cryptosporidium: NOT DETECTED
Cyclospora cayetanensis: NOT DETECTED
Entamoeba histolytica: NOT DETECTED
Enteroaggregative E coli (EAEC): NOT DETECTED
Enteropathogenic E coli (EPEC): NOT DETECTED
Enterotoxigenic E coli (ETEC): DETECTED — AB
Giardia lamblia: NOT DETECTED
NOROVIRUS GI/GII: DETECTED — AB
PLESIMONAS SHIGELLOIDES: NOT DETECTED
ROTAVIRUS A: NOT DETECTED
SALMONELLA SPECIES: NOT DETECTED
SAPOVIRUS (I, II, IV, AND V): NOT DETECTED
SHIGA LIKE TOXIN PRODUCING E COLI (STEC): NOT DETECTED
SHIGELLA/ENTEROINVASIVE E COLI (EIEC): NOT DETECTED
Vibrio cholerae: NOT DETECTED
Vibrio species: NOT DETECTED
Yersinia enterocolitica: NOT DETECTED

## 2017-06-30 LAB — BASIC METABOLIC PANEL
Anion gap: 6 (ref 5–15)
BUN: 13 mg/dL (ref 6–20)
CHLORIDE: 113 mmol/L — AB (ref 101–111)
CO2: 19 mmol/L — ABNORMAL LOW (ref 22–32)
CREATININE: 0.79 mg/dL (ref 0.44–1.00)
Calcium: 7.9 mg/dL — ABNORMAL LOW (ref 8.9–10.3)
Glucose, Bld: 90 mg/dL (ref 65–99)
POTASSIUM: 4.2 mmol/L (ref 3.5–5.1)
SODIUM: 138 mmol/L (ref 135–145)

## 2017-06-30 LAB — CBC
HEMATOCRIT: 34.9 % — AB (ref 36.0–46.0)
Hemoglobin: 11.6 g/dL — ABNORMAL LOW (ref 12.0–15.0)
MCH: 29.4 pg (ref 26.0–34.0)
MCHC: 33.2 g/dL (ref 30.0–36.0)
MCV: 88.6 fL (ref 78.0–100.0)
PLATELETS: 156 10*3/uL (ref 150–400)
RBC: 3.94 MIL/uL (ref 3.87–5.11)
RDW: 13.6 % (ref 11.5–15.5)
WBC: 6.6 10*3/uL (ref 4.0–10.5)

## 2017-06-30 LAB — UREA NITROGEN, URINE: UREA NITROGEN UR: 762 mg/dL

## 2017-06-30 MED ORDER — FAMOTIDINE 20 MG PO TABS: 20.0000 mg | ORAL_TABLET | Freq: Two times a day (BID) | ORAL | 1 refills | Status: DC

## 2017-06-30 MED ORDER — ONDANSETRON HCL 4 MG PO TABS: 4.0000 mg | ORAL_TABLET | Freq: Four times a day (QID) | ORAL | 0 refills | Status: DC | PRN

## 2017-06-30 NOTE — Progress Notes (Signed)
Received call from [facility].  Positive GI PCR - Patient positive for norovirus & enterotoxogenic ecoli.  Dr. Kerry HoughMemon notified via text page.

## 2017-06-30 NOTE — Discharge Summary (Signed)
Physician Discharge Summary  Paula Trevino:[id number] DOB: September 19, 1955 DOA: 06/29/2017  PCP: [facility]  Admit date: 06/29/2017 Discharge date: 06/30/2017  Admitted From: Home Disposition: Home  Recommendations for Outpatient Follow-up:  1. Follow up with PCP in 1-2 weeks 2. Please obtain BMP/CBC in one week   Home Health: Equipment/Devices:  Discharge Condition: Stable CODE STATUS: Full code Diet recommendation: Heart Healthy   Brief/Interim Summary: 62 year old female admitted to the hospital with vomiting and diarrhea.  She was noted to be dehydrated with acute kidney injury on admission.  Patient was started on IV fluids and supportive management.  Her renal function has since normalized.  Renal ultrasound was noted to be unremarkable.  Stool for C. difficile was found to be negative.  GI pathogen panel was positive for Norovirus as well as enterotoxigenic E. coli.  This patient was already clinically improving, treatment will just be supportive at this point.  She been advised to keep herself well-hydrated.  The remainder of medical problems remained stable.  She is feeling significantly better at this time and feels ready for discharge.  Discharge Diagnoses:  Principal Problem:   Acute kidney injury (HCC) Active Problems:   Migraine without aura   Nausea vomiting and diarrhea   Hypokalemia   Depression with anxiety   AKI (acute kidney injury) [facility])    Discharge Instructions  Discharge Instructions    Diet - low sodium heart healthy   Complete by:  As directed    Increase activity slowly   Complete by:  As directed      Allergies as of 06/30/2017      Reactions   Celecoxib Hives   Codeine Nausea And Vomiting   Morphine Other (See Comments)   Sumatriptan Other (See Comments)   Sulfa Antibiotics Rash, Hives      Medication List    TAKE these medications   ALPRAZolam 0.25 MG tablet Commonly known as:  XANAX Take 1 tablet (0.25 mg total)  by mouth at bedtime as needed for sleep. Please schedule appt   aspirin 81 MG tablet Take 81 mg by mouth daily.   buPROPion 150 MG 12 hr tablet Commonly known as:  WELLBUTRIN SR Take 1 tablet (150 mg total) by mouth 2 (two) times daily.   cyclobenzaprine 10 MG tablet Commonly known as:  FLEXERIL Take 1 tablet (10 mg total) by mouth at bedtime.   eletriptan 40 MG tablet Commonly known as:  RELPAX take 1 tablet by mouth AT ONSET OF HEADACHE MAY REPEAT IN 2 HOURS IF HEADACHE RECURS   famotidine 20 MG tablet Commonly known as:  PEPCID Take 1 tablet (20 mg total) by mouth 2 (two) times daily.   MOTRIN PO Take by mouth as needed.   ondansetron 4 MG tablet Commonly known as:  ZOFRAN Take 1 tablet (4 mg total) by mouth every 6 (six) hours as needed for nausea.   topiramate 25 MG tablet Commonly known as:  TOPAMAX Take 1 tablet in the morning and 2 tablets at night.   TYLENOL PO Take by mouth as needed.       Allergies  Allergen Reactions  . Celecoxib Hives  . Codeine Nausea And Vomiting  . Morphine Other (See Comments)  . Sumatriptan Other (See Comments)  . Sulfa Antibiotics Rash and Hives    Consultations:     Procedures/Studies: US Renal  Result Date: 06/29/2017 CLINICAL DATA:  Acute renal insufficiency with hypokalemia EXAM: RENAL / URINARY TRACT ULTRASOUND COMPLETE COMPARISON:  CT abdomen and pelvis June 24, 2004 FINDINGS: Right Kidney: Length: 12.2 cm. Echogenicity is increased. Renal cortical thickness is normal. No mass, perinephric fluid, or hydronephrosis visualized. No sonographically demonstrable calculus or ureterectasis. Left Kidney: Length: 12.2 cm. Echogenicity is increased. Renal cortical thickness is normal. No mass, perinephric fluid, or hydronephrosis visualized. No sonographically demonstrable calculus or ureterectasis. Bladder: Appears normal for degree of bladder distention. IMPRESSION: Increase in renal echogenicity bilaterally, a finding felt to  represent medical renal disease. No obstructing focus in either kidney. Renal cortical thickness normal. Study otherwise unremarkable. Electronically Signed   By: Bretta Bang III M.D.   On: 06/29/2017 11:03       Subjective: Feeling better today.  No vomiting.  Tolerating small amounts of solid food.  Diarrhea started to improve, stools are becoming more formed  Discharge Exam: Vitals:   06/29/17 2117 06/30/17 0615  BP: (!) 98/46 (!) 101/45  Pulse: 90 87  Resp:    Temp: 98.2 F (36.8 C) 98.2 F (36.8 C)  SpO2: 96% 97%   Vitals:   06/29/17 0640 06/29/17 1538 06/29/17 2117 06/30/17 0615  BP: (!) 101/51 (!) 101/52 (!) 98/46 (!) 101/45  Pulse: 96 95 90 87  Resp:  18    Temp: 98.6 F (37 C) 98.3 F (36.8 C) 98.2 F (36.8 C) 98.2 F (36.8 C)  TempSrc: Oral Oral Oral Oral  SpO2: 93% 97% 96% 97%  Weight: 62.6 kg (138 lb 1.6 oz)   65.4 kg (144 lb 2.9 oz)  Height: 5' (1.524 m)       General: Pt is alert, awake, not in acute distress Cardiovascular: RRR, S1/S2 +, no rubs, no gallops Respiratory: CTA bilaterally, no wheezing, no rhonchi Abdominal: Soft, NT, ND, bowel sounds + Extremities: no edema, no cyanosis    The results of significant diagnostics from this hospitalization (including imaging, microbiology, ancillary and laboratory) are listed below for reference.     Microbiology: Recent Results (from the past 240 hour(s))  Gastrointestinal Panel by PCR , Stool     Status: Abnormal   Collection Time: 06/29/17  3:54 AM  Result Value Ref Range Status   Campylobacter species NOT DETECTED NOT DETECTED Final   Plesimonas shigelloides NOT DETECTED NOT DETECTED Final   Salmonella species NOT DETECTED NOT DETECTED Final   Yersinia enterocolitica NOT DETECTED NOT DETECTED Final   Vibrio species NOT DETECTED NOT DETECTED Final   Vibrio cholerae NOT DETECTED NOT DETECTED Final   Enteroaggregative E coli (EAEC) NOT DETECTED NOT DETECTED Final   Enteropathogenic E coli  (EPEC) NOT DETECTED NOT DETECTED Final   Enterotoxigenic E coli (ETEC) DETECTED (A) NOT DETECTED Final    Comment: RESULT CALLED TO, READ BACK BY AND VERIFIED WITH: MARY BETH HAWKINS 06/30/17 @ 1603  MLK    Shiga like toxin producing E coli (STEC) NOT DETECTED NOT DETECTED Final   Shigella/Enteroinvasive E coli (EIEC) NOT DETECTED NOT DETECTED Final   Cryptosporidium NOT DETECTED NOT DETECTED Final   Cyclospora cayetanensis NOT DETECTED NOT DETECTED Final   Entamoeba histolytica NOT DETECTED NOT DETECTED Final   Giardia lamblia NOT DETECTED NOT DETECTED Final   Adenovirus F40/41 NOT DETECTED NOT DETECTED Final   Astrovirus NOT DETECTED NOT DETECTED Final   Norovirus GI/GII DETECTED (A) NOT DETECTED Final    Comment: RESULT CALLED TO, READ BACK BY AND VERIFIED WITH: MARY BETH HAWKINS 06/30/17 @ 1603  MLK    Rotavirus A NOT DETECTED NOT DETECTED Final   Sapovirus (I, II,  IV, and V) NOT DETECTED NOT DETECTED Final    Comment: Performed at [facility], [address] [place], [place] [address]  C difficile quick scan w PCR reflex     Status: None   Collection Time: 06/29/17 11:47 AM  Result Value Ref Range Status   C Diff antigen NEGATIVE NEGATIVE Final   C Diff toxin NEGATIVE NEGATIVE Final   C Diff interpretation No C. difficile detected.  Final    Comment: Performed at [facility], [address], [place], [place] [address]     Labs: BNP (last 3 results) No results for input(s): BNP in the last 8760 hours. Basic Metabolic Panel: Recent Labs  Lab 06/29/17 0210 06/29/17 1208 06/30/17 0638  NA 136 141 138  K 3.3* 3.3* 4.2  CL 100* 115* 113*  CO2 17* 17* 19*  GLUCOSE 131* 104* 90  BUN 58* 39* 13  CREATININE 3.38* 1.36* 0.79  CALCIUM 9.0 7.8* 7.9*  MG  --  1.8  --    Liver Function Tests: Recent Labs  Lab 06/29/17 0210  AST 31  ALT 26  ALKPHOS 70  BILITOT 0.7  PROT 7.9  ALBUMIN 4.4   Recent Labs  Lab 06/29/17 0210  LIPASE 21   No results for  input(s): AMMONIA in the last 168 hours. CBC: Recent Labs  Lab 06/29/17 0210 06/30/17 0638  WBC 11.5* 6.6  HGB 16.0* 11.6*  HCT 46.0 34.9*  MCV 87.5 88.6  PLT 202 156   Cardiac Enzymes: No results for input(s): CKTOTAL, CKMB, CKMBINDEX, TROPONINI in the last 168 hours. BNP: Invalid input(s): POCBNP CBG: No results for input(s): GLUCAP in the last 168 hours. D-Dimer No results for input(s): DDIMER in the last 72 hours. Hgb A1c No results for input(s): HGBA1C in the last 72 hours. Lipid Profile No results for input(s): CHOL, HDL, LDLCALC, TRIG, CHOLHDL, LDLDIRECT in the last 72 hours. Thyroid function studies No results for input(s): TSH, T4TOTAL, T3FREE, THYROIDAB in the last 72 hours.  Invalid input(s): FREET3 Anemia work up No results for input(s): VITAMINB12, FOLATE, FERRITIN, TIBC, IRON, RETICCTPCT in the last 72 hours. Urinalysis    Component Value Date/Time   COLORURINE YELLOW 06/29/2017 0355   APPEARANCEUR CLOUDY (A) 06/29/2017 0355   APPEARANCEUR Clear 03/27/2014 1010   LABSPEC 1.017 06/29/2017 0355   PHURINE 5.0 06/29/2017 0355   GLUCOSEU NEGATIVE 06/29/2017 0355   HGBUR SMALL (A) 06/29/2017 0355   BILIRUBINUR NEGATIVE 06/29/2017 0355   BILIRUBINUR Negative 03/27/2014 1010   KETONESUR NEGATIVE 06/29/2017 0355   PROTEINUR 30 (A) 06/29/2017 0355   UROBILINOGEN 0.2 08/30/2012 1025   NITRITE NEGATIVE 06/29/2017 0355   LEUKOCYTESUR NEGATIVE 06/29/2017 0355   LEUKOCYTESUR Negative 03/27/2014 1010   Sepsis Labs Invalid input(s): PROCALCITONIN,  WBC,  LACTICIDVEN Microbiology Recent Results (from the past 240 hour(s))  Gastrointestinal Panel by PCR , Stool     Status: Abnormal   Collection Time: 06/29/17  3:54 AM  Result Value Ref Range Status   Campylobacter species NOT DETECTED NOT DETECTED Final   Plesimonas shigelloides NOT DETECTED NOT DETECTED Final   Salmonella species NOT DETECTED NOT DETECTED Final   Yersinia enterocolitica NOT DETECTED NOT DETECTED  Final   Vibrio species NOT DETECTED NOT DETECTED Final   Vibrio cholerae NOT DETECTED NOT DETECTED Final   Enteroaggregative E coli (EAEC) NOT DETECTED NOT DETECTED Final   Enteropathogenic E coli (EPEC) NOT DETECTED NOT DETECTED Final   Enterotoxigenic E coli (ETEC) DETECTED (A) NOT DETECTED Final  Comment: RESULT CALLED TO, READ BACK BY AND VERIFIED WITH: MARY BETH HAWKINS 06/30/17 @ 1603  MLK    Shiga like toxin producing E coli (STEC) NOT DETECTED NOT DETECTED Final   Shigella/Enteroinvasive E coli (EIEC) NOT DETECTED NOT DETECTED Final   Cryptosporidium NOT DETECTED NOT DETECTED Final   Cyclospora cayetanensis NOT DETECTED NOT DETECTED Final   Entamoeba histolytica NOT DETECTED NOT DETECTED Final   Giardia lamblia NOT DETECTED NOT DETECTED Final   Adenovirus F40/41 NOT DETECTED NOT DETECTED Final   Astrovirus NOT DETECTED NOT DETECTED Final   Norovirus GI/GII DETECTED (A) NOT DETECTED Final    Comment: RESULT CALLED TO, READ BACK BY AND VERIFIED WITH: MARY BETH HAWKINS 06/30/17 @ 1603  MLK    Rotavirus A NOT DETECTED NOT DETECTED Final   Sapovirus (I, II, IV, and V) NOT DETECTED NOT DETECTED Final    Comment: Performed at [facility], [address] [place], [place] [address]  C difficile quick scan w PCR reflex     Status: None   Collection Time: 06/29/17 11:47 AM  Result Value Ref Range Status   C Diff antigen NEGATIVE NEGATIVE Final   C Diff toxin NEGATIVE NEGATIVE Final   C Diff interpretation No C. difficile detected.  Final    Comment: Performed at [facility], [address] [place], [place] [address]     Time coordinating discharge: Over 30 minutes  SIGNED:   Erick BlinksJehanzeb Memon, MD  [facility] 06/30/2017, 7:38 PM Pager   If 7PM-7AM, please contact night-coverage [email or web address] Password [email or web address]

## 2017-06-30 NOTE — Progress Notes (Signed)
Patient states understanding of discharge instructions.  

## 2017-07-02 ENCOUNTER — Ambulatory Visit: Payer: BC Managed Care – PPO | Admitting: Neurology

## 2017-10-12 ENCOUNTER — Other Ambulatory Visit: Payer: Self-pay | Admitting: Neurology

## 2017-10-25 ENCOUNTER — Telehealth: Payer: Self-pay | Admitting: Neurology

## 2017-10-25 DIAGNOSIS — Z716 Tobacco abuse counseling: Secondary | ICD-10-CM

## 2017-10-25 NOTE — Telephone Encounter (Signed)
Pt has called for a refill on 3 of her medications(pt has her 9 month f/u shceduled for 11-20-2017) she needs a refill on her :topiramate (TOPAMAX) 25 MG tablet, eletriptan (RELPAX) 40 MG tablet and buPROPion (WELLBUTRIN SR) 150 MG 12 hr tablet Pt would like them called into  [facility] [phone]#[id number] - [place], [place] - [address] [facility] [address] [phone] (Phone) [phone] (Fax)

## 2017-10-26 MED ORDER — TOPIRAMATE 25 MG PO TABS: ORAL_TABLET | ORAL | 0 refills | Status: DC

## 2017-10-26 MED ORDER — BUPROPION HCL ER (SR) 150 MG PO TB12: 150.0000 mg | ORAL_TABLET | Freq: Two times a day (BID) | ORAL | 0 refills | Status: DC

## 2017-10-26 MED ORDER — ELETRIPTAN HYDROBROMIDE 40 MG PO TABS: ORAL_TABLET | ORAL | 0 refills | Status: DC

## 2017-10-26 NOTE — Addendum Note (Signed)
Addended by: Bertram SavinULBERTSON, BETHANY L on: 10/26/2017 08:31 AM   Modules accepted: Orders

## 2017-10-26 NOTE — Telephone Encounter (Addendum)
Refills x 1 of Topiramate, Eletriptan, and Buproprion e-scribed to pharmacy per pt request. Noted pt has a scheduled f/u 11/20/17. Called pt and LVM (ok per DPR) informing her that refills were sent and callback is not required.

## 2017-10-26 NOTE — Telephone Encounter (Signed)
Pt call back before checking VM. Message relayed, she understood and was appreciative

## 2017-11-20 ENCOUNTER — Encounter: Payer: Self-pay | Admitting: Neurology

## 2017-11-20 ENCOUNTER — Encounter

## 2017-11-20 ENCOUNTER — Ambulatory Visit: Payer: BC Managed Care – PPO | Admitting: Neurology

## 2017-11-20 VITALS — BP 107/65 | HR 75 | Ht 60.0 in | Wt 142.0 lb

## 2017-11-20 DIAGNOSIS — Z716 Tobacco abuse counseling: Secondary | ICD-10-CM

## 2017-11-20 DIAGNOSIS — M62838 Other muscle spasm: Secondary | ICD-10-CM

## 2017-11-20 DIAGNOSIS — G44219 Episodic tension-type headache, not intractable: Secondary | ICD-10-CM | POA: Diagnosis not present

## 2017-11-20 DIAGNOSIS — F17200 Nicotine dependence, unspecified, uncomplicated: Secondary | ICD-10-CM | POA: Diagnosis not present

## 2017-11-20 DIAGNOSIS — G4700 Insomnia, unspecified: Secondary | ICD-10-CM | POA: Diagnosis not present

## 2017-11-20 DIAGNOSIS — IMO0001 Reserved for inherently not codable concepts without codable children: Secondary | ICD-10-CM

## 2017-11-20 DIAGNOSIS — G43009 Migraine without aura, not intractable, without status migrainosus: Secondary | ICD-10-CM

## 2017-11-20 MED ORDER — ELETRIPTAN HYDROBROMIDE 40 MG PO TABS: ORAL_TABLET | ORAL | 11 refills | Status: DC

## 2017-11-20 MED ORDER — TOPIRAMATE 25 MG PO TABS: ORAL_TABLET | ORAL | 4 refills | Status: DC

## 2017-11-20 MED ORDER — ALPRAZOLAM 0.25 MG PO TABS: 0.2500 mg | ORAL_TABLET | Freq: Every evening | ORAL | 5 refills | Status: DC | PRN

## 2017-11-20 MED ORDER — BUPROPION HCL ER (SR) 150 MG PO TB12: 150.0000 mg | ORAL_TABLET | Freq: Every day | ORAL | 4 refills | Status: DC

## 2017-11-20 MED ORDER — CYCLOBENZAPRINE HCL 10 MG PO TABS: 10.0000 mg | ORAL_TABLET | Freq: Every day | ORAL | 4 refills | Status: DC

## 2017-11-20 MED ORDER — FLUOXETINE HCL 20 MG PO CAPS: 20.0000 mg | ORAL_CAPSULE | Freq: Every day | ORAL | 6 refills | Status: DC

## 2017-11-20 MED ORDER — ONDANSETRON HCL 4 MG PO TABS: 4.0000 mg | ORAL_TABLET | Freq: Four times a day (QID) | ORAL | 4 refills | Status: DC | PRN

## 2017-11-20 NOTE — Patient Instructions (Addendum)
Start Prozac take early in the morning with Wellbutrin Continue all other medications  Fluoxetine capsules or tablets (Depression/Mood Disorders/Headache) What is this medicine? FLUOXETINE (floo OX e teen) belongs to a class of drugs known as selective serotonin reuptake inhibitors (SSRIs). It helps to treat mood problems such as depression, obsessive compulsive disorder, and panic attacks and also headaches. It can also treat certain eating disorders. This medicine may be used for other purposes; ask your health care provider or pharmacist if you have questions. COMMON BRAND NAME(S): Prozac What should I tell my health care provider before I take this medicine? They need to know if you have any of these conditions: -bipolar disorder or a family history of bipolar disorder -bleeding disorders -glaucoma -heart disease -liver disease -low levels of sodium in the blood -seizures -suicidal thoughts, plans, or attempt; a previous suicide attempt by you or a family member -take MAOIs like Carbex, Eldepryl, Marplan, Nardil, and Parnate -take medicines that treat or prevent blood clots -thyroid disease -an unusual or allergic reaction to fluoxetine, other medicines, foods, dyes, or preservatives -pregnant or trying to get pregnant -breast-feeding How should I use this medicine? Take this medicine by mouth with a glass of water. Follow the directions on the prescription label. You can take this medicine with or without food. Take your medicine at regular intervals. Do not take it more often than directed. Do not stop taking this medicine suddenly except upon the advice of your doctor. Stopping this medicine too quickly may cause serious side effects or your condition may worsen. A special MedGuide will be given to you by the pharmacist with each prescription and refill. Be sure to read this information carefully each time. Talk to your pediatrician regarding the use of this medicine in children.  While this drug may be prescribed for children as young as 7 years for selected conditions, precautions do apply. Overdosage: If you think you have taken too much of this medicine contact a poison control center or emergency room at once. NOTE: This medicine is only for you. Do not share this medicine with others. What if I miss a dose? If you miss a dose, skip the missed dose and go back to your regular dosing schedule. Do not take double or extra doses. What may interact with this medicine? Do not take this medicine with any of the following medications: -other medicines containing fluoxetine, like Sarafem or Symbyax -cisapride -linezolid -MAOIs like Carbex, Eldepryl, Marplan, Nardil, and Parnate -methylene blue (injected into a vein) -pimozide -thioridazine This medicine may also interact with the following medications: -alcohol -amphetamines -aspirin and aspirin-like medicines -carbamazepine -certain medicines for depression, anxiety, or psychotic disturbances -certain medicines for migraine headaches like almotriptan, eletriptan, frovatriptan, naratriptan, rizatriptan, sumatriptan, zolmitriptan -digoxin -diuretics -fentanyl -flecainide -furazolidone -isoniazid -lithium -medicines for sleep -medicines that treat or prevent blood clots like warfarin, enoxaparin, and dalteparin -NSAIDs, medicines for pain and inflammation, like ibuprofen or naproxen -phenytoin -procarbazine -propafenone -rasagiline -ritonavir -supplements like St. John's wort, kava kava, valerian -tramadol -tryptophan -vinblastine This list may not describe all possible interactions. Give your health care provider a list of all the medicines, herbs, non-prescription drugs, or dietary supplements you use. Also tell them if you smoke, drink alcohol, or use illegal drugs. Some items may interact with your medicine. What should I watch for while using this medicine? Tell your doctor if your symptoms do not get  better or if they get worse. Visit your doctor or health care professional for regular checks on  your progress. Because it may take several weeks to see the full effects of this medicine, it is important to continue your treatment as prescribed by your doctor. Patients and their families should watch out for new or worsening thoughts of suicide or depression. Also watch out for sudden changes in feelings such as feeling anxious, agitated, panicky, irritable, hostile, aggressive, impulsive, severely restless, overly excited and hyperactive, or not being able to sleep. If this happens, especially at the beginning of treatment or after a change in dose, call your health care professional. Bonita QuinYou may get drowsy or dizzy. Do not drive, use machinery, or do anything that needs mental alertness until you know how this medicine affects you. Do not stand or sit up quickly, especially if you are an older patient. This reduces the risk of dizzy or fainting spells. Alcohol may interfere with the effect of this medicine. Avoid alcoholic drinks. Your mouth may get dry. Chewing sugarless gum or sucking hard candy, and drinking plenty of water may help. Contact your doctor if the problem does not go away or is severe. This medicine may affect blood sugar levels. If you have diabetes, check with your doctor or health care professional before you change your diet or the dose of your diabetic medicine. What side effects may I notice from receiving this medicine? Side effects that you should report to your doctor or health care professional as soon as possible: -allergic reactions like skin rash, itching or hives, swelling of the face, lips, or tongue -anxious -black, tarry stools -breathing problems -changes in vision -confusion -elevated mood, decreased need for sleep, racing thoughts, impulsive behavior -eye pain -fast, irregular heartbeat -feeling faint or lightheaded, falls -feeling agitated, angry, or  irritable -hallucination, loss of contact with reality -loss of balance or coordination -loss of memory -painful or prolonged erections -restlessness, pacing, inability to keep still -seizures -stiff muscles -suicidal thoughts or other mood changes -trouble sleeping -unusual bleeding or bruising -unusually weak or tired -vomiting Side effects that usually do not require medical attention (report to your doctor or health care professional if they continue or are bothersome): -change in appetite or weight -change in sex drive or performance -diarrhea -dry mouth -headache -increased sweating -nausea -tremors This list may not describe all possible side effects. Call your doctor for medical advice about side effects. You may report side effects to FDA at 1-800-FDA-1088. Where should I keep my medicine? Keep out of the reach of children. Store at room temperature between 15 and 30 degrees C (59 and 86 degrees F). Throw away any unused medicine after the expiration date. NOTE: This sheet is a summary. It may not cover all possible information. If you have questions about this medicine, talk to your doctor, pharmacist, or health care provider.  2018 Elsevier/Gold Standard (2015-08-28 15:55:27)

## 2017-11-20 NOTE — Progress Notes (Signed)
[facility]   Provider:  Dr Lucia Gaskins Referring Provider: Assunta Found, MD Primary Care Physician:  [facility]  CC: Migraine  Interval history 11/20/2017: Patient is here, follow up on migraines. Recommended sleep eval but she never complied. She is on Topamax, relpax, flexeril, still smoking. There is stress with her daughter who has pain after c-section. They had to go to [place] to find someone to help. The Topamax is helping, she has a "tense head" moreso to stress. Discussed, will start Prozac in addition to the wellbutrin.   Interval history 10/03/2016: Patient is here for yearly follow-up of migraines. She has done well on Topamax, Relpax, Flexeril. Recommend sleep eval but she never complied. Had a long discussion about smoking cessation, started her on Wellbutrin and recommended hypnotist.  Not much has changed. She went for 8 months without smoking, she had a relapse in April but quit in the last week and back on the wellbutrin. Worse in the spring. She has hadaches often during the month little mild headache and severe migraines are once every 2 months but monthly 4 migraines but needs . Relpax helps when she takes it for migraines. Takes tylenol occasionally, she really don;t keep track. Asked her to keep a migraine diary. 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  Interval history 10/04/2015:  Here for follow up of migraines. She never had her sleep eval, discussed untreated sleep apnea can cause increased risk of stroke and other sequelae. Her migraines are feeling better. She is terrified that she has cancer, she has been smoking so long, she wants to quit. Discussed at length. Will try her on Wellbutrin. Recommended hypnotist who performs this for smoking. Also recommended therapy for smoking cessation. Discussed other medications and  side effects, chantix for example. Decided on Wellbutrin. Discussed side effects as detailed in the patient instructions. Asked her to use my chart to keep me informed of her progress. She clenches her jaw at night, may be stress, has seen a dentist and can't afford the mouthguard.   Interval History 10/01/2014: Paula Trevino is a 62 y.o. female here as a follow up for migraine. She is transitioning to my care.She is waking up with headachesand thinks she is clenching her teeth or snoring. She is going to the dentist to see if teeth clenching is causing her headaches. She feels lightheaded and some tingling every once in a while but otherwise no side effects from the Topiramate. She takes one Topiramate the morning and 2 at night. Relpax helps with acute management. Snores at night. Excessive daytime sleepiness. Morning headache. ESS 14.  Interval History 03/27/2014: When she is not drinking enough water she gets burning urination. She is not sleeping well. She used to take Xanax. She is not getting any migraines. Has some tingling in the fingers occasionally but otherwise doing well. She falls asleep but she wakes up at midnight and can't go to sleep for hours. Doesn't know why she is waking up, possibly to urinate then can't go back to sleep. She used to take Xanax every night but got off of it. She is not a good sleeper.   09/29/13 (LL): Since last visit 6 months ago, headaches have picked up again in frequency as weather has gotten hotter. Side effects of TPX diminished. She is having more frequent headaches that have a more tight, pressure quality,  but not many severe headaches. Overall much better than before going on TPX. She has not had great response from Sumatriptan; always needing second dose and not fully relieving headache. Her daughter has had good response from Relpax.  03/10/13 (LL): Patient returns for 2 month follow up for Migraines, they have greatly improved on TPX. She is  having some tingling in fingers and occasional facial numbness, balance seems off, nausea first few weeks (resolved). She is happy not to have severe headache though. Still having 1-2 milder headaches, treating with ibuprofen. She has not had to use Sumatriptan. She is happy with current treatment.   01/07/13 (VP): 62 year old right-handed female here for a evaluation of headaches, recurrent since 2012, but worsened since July 2014. Patient was in her 4620s, she had developed right-sided headaches with photophobia, pulsating and throbbing sensation. She took over-the-counter medications with good relief. However, she was not officially diagnosed with migraine. By late 30s and 40s, she had stopped having headaches. 2012 headaches returned. Since summer 2014, headaches have been significantly worsen. She had recurrent right-sided throbbing severe headaches with retro-orbital pain, nausea, photophobia. She was sensitive to smells. She's been taking Excedrin Migraine, Benadryl, Motrin, Tylenol as a for headaches. Patient went to the emergency room one time because of severe headaches, received headache cocktails and CT scan head which are unremarkable. Since then she's had lingering daily headaches. She has 2-3 severe headaches per week. Strong family history of migraine headaches in patient's sister, daughter, maternal great aunt. Patient is never tried anti-migraine medications in the past. No specific triggering factors  Review of Systems: Patient complains of symptoms per HPI as well as the following symptoms: Snoring, daytime sleepiness. Pertinent negatives per HPI. All others negative.   Social History   Socioeconomic History  . Marital status: Widowed    Spouse name: Not on file  . Number of children: 1  . Years of education: BS  . Highest education level: Not on file  Occupational History  . Occupation: Retired    Comment: Data processing managerTeacher  Social Needs  . Financial resource strain: Not on file  .  Food insecurity:    Worry: Not on file    Inability: Not on file  . Transportation needs:    Medical: Not on file    Non-medical: Not on file  Tobacco Use  . Smoking status: Current Some Day Smoker    Packs/day: 0.50    Types: Cigarettes  . Smokeless tobacco: Never Used  . Tobacco comment: on and off   Substance and Sexual Activity  . Alcohol use: Yes    Comment: occasionally wine  . Drug use: No  . Sexual activity: Never    Birth control/protection: Post-menopausal  Lifestyle  . Physical activity:    Days per week: Not on file    Minutes per session: Not on file  . Stress: Not on file  Relationships  . Social connections:    Talks on phone: Not on file    Gets together: Not on file    Attends religious service: Not on file    Active member of club or organization: Not on file    Attends meetings of clubs or organizations: Not on file    Relationship status: Not on file  . Intimate partner violence:    Fear of current or ex partner: Not on file    Emotionally abused: Not on file    Physically abused: Not on file    Forced sexual activity: Not on file  Other Topics Concern  . Not on file  Social History Narrative   Patient lives at home alone.   Caffeine Use: 1 soda and a few cups of coffee daily   Right handed    Family History  Problem Relation Age of Onset  . Hypertension Mother   . Heart disease Father   . Stroke Maternal Grandmother   . Heart disease Paternal Grandmother   . Cancer Paternal Grandfather        Stomach  . Migraines Sister   . Migraines Maternal Aunt        great aunt  . Migraines Daughter   . Other Daughter        severe nerve damage, chronic pain    Past Medical History:  Diagnosis Date  . Arthritis   . Colon polyps   . IBS (irritable bowel syndrome)   . Norovirus 06/2017    Past Surgical History:  Procedure Laterality Date  . CESAREAN SECTION  1991  . CHOLECYSTECTOMY  1997  . SHOULDER SURGERY      Current Outpatient  Medications  Medication Sig Dispense Refill  . Acetaminophen (TYLENOL PO) Take by mouth as needed.    . ALPRAZolam (XANAX) 0.25 MG tablet Take 1 tablet (0.25 mg total) by mouth at bedtime as needed for sleep. 15 tablet 5  . aspirin 81 MG tablet Take 81 mg by mouth daily.    Marland Kitchen. buPROPion (WELLBUTRIN SR) 150 MG 12 hr tablet Take 1 tablet (150 mg total) by mouth daily. 90 tablet 4  . cyclobenzaprine (FLEXERIL) 10 MG tablet Take 1 tablet (10 mg total) by mouth at bedtime. 90 tablet 4  . eletriptan (RELPAX) 40 MG tablet take 1 tablet by mouth AT ONSET OF HEADACHE MAY REPEAT IN 2 HOURS IF HEADACHE RECURS 10 tablet 11  . Ibuprofen (MOTRIN PO) Take by mouth as needed.    . ondansetron (ZOFRAN) 4 MG tablet Take 1 tablet (4 mg total) by mouth every 6 (six) hours as needed for nausea. 60 tablet 4  . topiramate (TOPAMAX) 25 MG tablet TAKE 1 TABLET BY MOUTH EVERY MORNING AND 2 TABLETS EVERY EVENING 270 tablet 4  . famotidine (PEPCID) 20 MG tablet Take 1 tablet (20 mg total) by mouth 2 (two) times daily. (Patient not taking: Reported on 11/20/2017) 60 tablet 1  . FLUoxetine (PROZAC) 20 MG capsule Take 1 capsule (20 mg total) by mouth daily. 30 capsule 6   No current facility-administered medications for this visit.     Allergies as of 11/20/2017 - Review Complete 11/20/2017  Allergen Reaction Noted  . Celecoxib Hives 10/01/2014  . Codeine Nausea And Vomiting 08/30/2012  . Morphine Other (See Comments) 10/01/2014  . Sumatriptan Other (See Comments) 10/01/2014  . Sulfa antibiotics Rash and Hives 08/29/2012    Vitals: BP 107/65 (BP Location: Right Arm, Patient Position: Sitting)   Pulse 75   Ht 5' (1.524 m)   Wt 142 lb (64.4 kg)   BMI 27.73 kg/m  Last Weight:  Wt Readings from Last 1 Encounters:  11/20/17 142 lb (64.4 kg)   Last Height:   Ht Readings from Last 1 Encounters:  11/20/17 5' (1.524 m)   Physical exam: Exam: Gen: NAD, conversant, well nourised, obese, well groomed                      Eyes: Conjunctivae clear without exudates or hemorrhage  Neuro: Detailed Neurologic Exam  Speech:    Speech is normal; fluent  and spontaneous with normal comprehension.  Cognition:    The patient is oriented to person, place, and time;  Cranial Nerves:    The pupils are equal, round, and reactive to light.  Visual fields are full to finger confrontation. Extraocular movements are intact. Trigeminal sensation is intact and the muscles of mastication are normal. The face is symmetric. The palate elevates in the midline. Hearing intact. Voice is normal. Shoulder shrug is normal. The tongue has normal motion without fasciculations.   Motor Observation:    No asymmetry, no atrophy, and no involuntary movements noted. Tone:    Normal muscle tone.    Posture:    Posture is normal. normal erect    Strength:    Strength is V/V in the upper and lower limbs.      Sensation: intact to LT     Assessment and plan: 62 y.o. year old female here for f/up of headache. She appears well controlled on Topamax and Relpax. Wants to quit smoking. Has anxiety and fearful she has cancer because she has smoked for so long.    Smoking: On wellbutrin, had stopped but restarted due to stress. Discussed.   Weight gain: unclear, she is stressed. Still smoking.  Flexeril at night for clenching at night  Topamax has made mouth dry. 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 Offered PT for TMJ she declines, will continue to work with dentist on night guard. Same today's appointment. Improved.   Remember to drink plenty of fluid, eat healthy meals and do not skip any meals. Try to eat protein with a every meal and eat a healthy snack such as fruit or nuts in between meals. Try to keep a regular sleep-wake schedule and try to exercise daily, particularly in the form of walking, 20-30 minutes a day, if you can.   Continue the Topamax for migraine, doing  well  Tension headaches: for stress, will start Prozac Flexeril at night for jaw clenching Discussed Aimovig  Start Prozac in addition to the Wellbutrin for stress and "teariness" Also wellbutrin may help with smoking cravings Denies botox for migraines, not interested Recommend sleep study for morning headaches, she declines at this time  Silas Flood Hypnotist, for smoking, [phone] $300 for 3 sessions, discussed again  Naomie Dean, MD  [facility] [address] [place], [place] [address]  Phone [phone] Fax [phone]  A total of 25 minutes was spent face-to-face with this patient. Over half this time was spent on counseling patient on the   1. Episodic tension-type headache, not intractable   2. [place] spasm   3. Smoking   4. Insomnia, unspecified type   5. Encounter for smoking cessation counseling   6. Migraine without aura and without status migrainosus, not intractable    diagnosis and different diagnostic and therapeutic options available.

## 2018-11-25 ENCOUNTER — Ambulatory Visit: Payer: BC Managed Care – PPO | Admitting: Neurology

## 2018-11-25 ENCOUNTER — Encounter: Payer: Self-pay | Admitting: Neurology

## 2018-11-25 ENCOUNTER — Other Ambulatory Visit: Payer: Self-pay

## 2018-11-25 VITALS — BP 117/77 | HR 81 | Temp 96.9°F | Ht 61.0 in | Wt 149.0 lb

## 2018-11-25 DIAGNOSIS — G43709 Chronic migraine without aura, not intractable, without status migrainosus: Secondary | ICD-10-CM | POA: Diagnosis not present

## 2018-11-25 MED ORDER — FLUOXETINE HCL 20 MG PO CAPS: 20.0000 mg | ORAL_CAPSULE | Freq: Every day | ORAL | 4 refills | Status: DC

## 2018-11-25 MED ORDER — AJOVY 225 MG/1.5ML ~~LOC~~ SOAJ: 225.0000 mg | SUBCUTANEOUS | 11 refills | Status: DC

## 2018-11-25 MED ORDER — TOPIRAMATE 25 MG PO TABS: ORAL_TABLET | ORAL | 4 refills | Status: DC

## 2018-11-25 MED ORDER — CYCLOBENZAPRINE HCL 10 MG PO TABS: 10.0000 mg | ORAL_TABLET | Freq: Every day | ORAL | 4 refills | Status: DC

## 2018-11-25 NOTE — Progress Notes (Signed)
[facility]   Provider:  Dr Lucia GaskinsAhern Referring Provider: Assunta FoundGolding, John, MD Primary Care Physician:  [facility]  CC: Migraine  Interval history 11/25/2018: patient here for follow up of headaches and migraines last seen approx. One year ago.She has been on topamax with good results.  Recommended sleep eval for morning headaches and she declined. For her smoking I started her on Wellbutrin and recommended smoking counseling in the past.  Since July her migraines are worse. She wakes with headaches and through the day she is getting daily headaches and migraines. She has daily dull headaches. Discussed management, will continue Topamax and Eletriptan. Significant history of most women on her mother's side and daughter.   Interval history 11/20/2017: Patient is here, follow up on migraines. Recommended sleep eval but she never complied. She is on Topamax, relpax, flexeril, still smoking. There is stress with her daughter who has pain after c-section. [place] go [place] find someone to help. The Topamax is helping, she has a "tense head" moreso to stress. Discussed, will start Prozac in addition to the wellbutrin.   Interval history 10/03/2016: Patient is here for yearly follow-up of migraines. She has done well on Topamax, Relpax, Flexeril. Recommend sleep eval but she never complied. Had a long discussion about smoking cessation, started her on Wellbutrin and recommended hypnotist.  Not much has changed. She went for 8 months without smoking, she had a relapse in April but quit in the last week and back on the wellbutrin. Worse in the spring. She has hadaches often during the month little mild headache and severe migraines are once every 2 months but monthly 4 migraines but needs . Relpax helps when she takes it for migraines. Takes tylenol occasionally, she really don;t keep track. Asked her to keep a migraine diary. Discussed stopping the topiramate but  she hesitates to change anything right now and will follow up in 6 months.She is working with her dentist on the TMJ.  Interval history 10/04/2015:  Here for follow up of migraines. She never had her sleep eval, discussed untreated sleep apnea can cause increased risk of stroke and other sequelae. Her migraines are feeling better. She is terrified that she has cancer, she has been smoking so long, she wants to quit. Discussed at length. Will try her on Wellbutrin. Recommended hypnotist who performs this for smoking. Also recommended therapy for smoking cessation. Discussed other medications and side effects, chantix for example. Decided on Wellbutrin. Discussed side effects as detailed in the patient instructions. Asked her to use my chart to keep me informed of her progress. She clenches her jaw at night, may be stress, has seen a dentist and can't afford the mouthguard.   Interval History 10/01/2014: Paula Trevino is a 63 y.o. female here as a follow up for migraine. She is transitioning to my care.She is waking up with headachesand thinks she is clenching her teeth or snoring. She is going to the dentist to see if teeth clenching is causing her headaches. She feels lightheaded and some tingling every once in a while but otherwise no side effects from the Topiramate. She takes one Topiramate the morning and 2 at night. Relpax helps with acute management. Snores at night. Excessive daytime sleepiness. Morning headache. ESS 14.  Interval History 03/27/2014: When she is not drinking enough water she gets burning urination. She is not sleeping well. She used to take Xanax. She is not getting any migraines. Has some tingling in  the fingers occasionally but otherwise doing well. She falls asleep but she wakes up at midnight and can't go to sleep for hours. Doesn't know why she is waking up, possibly to urinate then can't go back to sleep. She used to take Xanax every night but got off of it. She is not a good  sleeper.   09/29/13 (LL): Since last visit 6 months ago, headaches have picked up again in frequency as weather has gotten hotter. Side effects of TPX diminished. She is having more frequent headaches that have a more tight, pressure quality, but not many severe headaches. Overall much better than before going on TPX. She has not had great response from Sumatriptan; always needing second dose and not fully relieving headache. Her daughter has had good response from Relpax.  03/10/13 (LL): Patient returns for 2 month follow up for Migraines, they have greatly improved on TPX. She is having some tingling in fingers and occasional facial numbness, balance seems off, nausea first few weeks (resolved). She is happy not to have severe headache though. Still having 1-2 milder headaches, treating with ibuprofen. She has not had to use Sumatriptan. She is happy with current treatment.   01/07/13 (VP): 63 year old right-handed female here for a evaluation of headaches, recurrent since 2012, but worsened since July 2014. Patient was in her 5120s, she had developed right-sided headaches with photophobia, pulsating and throbbing sensation. She took over-the-counter medications with good relief. However, she was not officially diagnosed with migraine. By late 30s and 40s, she had stopped having headaches. 2012 headaches returned. Since summer 2014, headaches have been significantly worsen. She had recurrent right-sided throbbing severe headaches with retro-orbital pain, nausea, photophobia. She was sensitive to smells. She's been taking Excedrin Migraine, Benadryl, Motrin, Tylenol as a for headaches. Patient went to the emergency room one time because of severe headaches, received headache cocktails and CT scan head which are unremarkable. Since then she's had lingering daily headaches. She has 2-3 severe headaches per week. Strong family history of migraine headaches in patient's sister, daughter, maternal great aunt.  Patient is never tried anti-migraine medications in the past. No specific triggering factors  Review of Systems: Patient complains of symptoms per HPI as well as the following symptoms: Snoring, daytime sleepiness. There is stress. Worsening headaches.  Pertinent negatives per HPI. All others negative.   Social History   Socioeconomic History  . Marital status: Widowed    Spouse name: Not on file  . Number of children: 1  . Years of education: BS  . Highest education level: Not on file  Occupational History  . Occupation: Retired    Comment: Data processing managerTeacher  Social Needs  . Financial resource strain: Not on file  . Food insecurity    Worry: Not on file    Inability: Not on file  . Transportation needs    Medical: Not on file    Non-medical: Not on file  Tobacco Use  . Smoking status: Current Some Day Smoker    Packs/day: 0.50    Types: Cigarettes  . Smokeless tobacco: Never Used  . Tobacco comment: on and off. Smoke free on 11/25/2018.  Substance and Sexual Activity  . Alcohol use: Yes    Comment: occasionally wine  . Drug use: No  . Sexual activity: Never    Birth control/protection: Post-menopausal  Lifestyle  . Physical activity    Days per week: Not on file    Minutes per session: Not on file  . Stress: Not on file  Relationships  . Social Herbalist on phone: Not on file    Gets together: Not on file    Attends religious service: Not on file    Active member of club or organization: Not on file    Attends meetings of clubs or organizations: Not on file    Relationship status: Not on file  . Intimate partner violence    Fear of current or ex partner: Not on file    Emotionally abused: Not on file    Physically abused: Not on file    Forced sexual activity: Not on file  Other Topics Concern  . Not on file  Social History Narrative   Patient lives at home alone.   Caffeine Use: 1 soda and a few cups of coffee daily   Right handed    Family History   Problem Relation Age of Onset  . Hypertension Mother   . Cancer Mother   . Heart disease Father   . Stroke Maternal Grandmother   . Heart disease Paternal Grandmother   . Cancer Paternal Grandfather        Stomach  . Migraines Sister   . Migraines Maternal Aunt        great aunt  . Migraines Daughter   . Other Daughter        severe nerve damage, chronic pain    Past Medical History:  Diagnosis Date  . Arthritis   . Colon polyps   . IBS (irritable bowel syndrome)   . Norovirus 06/2017    Past Surgical History:  Procedure Laterality Date  . CESAREAN SECTION  1991  . CHOLECYSTECTOMY  1997  . SHOULDER SURGERY      Current Outpatient Medications  Medication Sig Dispense Refill  . Acetaminophen (TYLENOL PO) Take by mouth as needed.    . ALPRAZolam (XANAX) 0.25 MG tablet Take 1 tablet (0.25 mg total) by mouth at bedtime as needed for sleep. 15 tablet 5  . aspirin 81 MG tablet Take 81 mg by mouth daily.    . cyclobenzaprine (FLEXERIL) 10 MG tablet Take 1 tablet (10 mg total) by mouth at bedtime. 90 tablet 4  . eletriptan (RELPAX) 40 MG tablet take 1 tablet by mouth AT ONSET OF HEADACHE MAY REPEAT IN 2 HOURS IF HEADACHE RECURS 10 tablet 11  . FLUoxetine (PROZAC) 20 MG capsule Take 1 capsule (20 mg total) by mouth daily. 90 capsule 4  . Ibuprofen (MOTRIN PO) Take by mouth as needed.    . topiramate (TOPAMAX) 25 MG tablet TAKE 1 TABLET BY MOUTH EVERY MORNING AND 2 TABLETS EVERY EVENING 270 tablet 4  . Fremanezumab-vfrm (AJOVY) 225 MG/1.5ML SOAJ Inject 225 mg into the skin every 30 (thirty) days. 1 pen 11   No current facility-administered medications for this visit.     Allergies as of 11/25/2018 - Review Complete 11/25/2018  Allergen Reaction Noted  . Celecoxib Hives 10/01/2014  . Codeine Nausea And Vomiting 08/30/2012  . Morphine Other (See Comments) 10/01/2014  . Sumatriptan Other (See Comments) 10/01/2014  . Sulfa antibiotics Rash and Hives 08/29/2012    Vitals: BP  117/77   Pulse 81   Temp (!) 96.9 F (36.1 C)   Ht 5\' 1"  (1.549 m)   Wt 149 lb (67.6 kg)   BMI 28.15 kg/m  Last Weight:  Wt Readings from Last 1 Encounters:  11/25/18 149 lb (67.6 kg)   Last Height:   Ht Readings from Last 1 Encounters:  11/25/18  5\' 1"  (1.549 m)   Physical exam: Exam: Gen: NAD, conversant, well nourised, obese, well groomed                     Eyes: Conjunctivae clear without exudates or hemorrhage  Neuro: Detailed Neurologic Exam  Speech:    Speech is normal; fluent and spontaneous with normal comprehension.  Cognition:    The patient is oriented to person, place, and time;  Cranial Nerves:    The pupils are equal, round, and reactive to light.  Visual fields are full to finger confrontation. Extraocular movements are intact. Trigeminal sensation is intact and the muscles of mastication are normal. The face is symmetric. The palate elevates in the midline. Hearing intact. Voice is normal. Shoulder shrug is normal. The tongue has normal motion without fasciculations.   Motor Observation:    No asymmetry, no atrophy, and no involuntary movements noted. Tone:    Normal muscle tone.    Posture:    Posture is normal. normal erect    Strength:    Strength is V/V in the upper and lower limbs.      Sensation: intact to LT     Assessment and plan: 63 y.o. year old female here for f/up of headache. She is on Topamax and Relpax.  She also has TMJ.    Smoking: On wellbutrin, had stopped but restarted due to stress. Discussed.   Weight gain: unclear, she is stressed. Still smoking.  Flexeril at night for clenching at night  Topamax has made mouth dry. 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 Offered PT for TMJ she declines, will continue to work with dentist on night guard. Same today's appointment. Improved.   Remember to drink plenty of fluid, eat healthy meals and do not skip any meals. Try to  eat protein with a every meal and eat a healthy snack such as fruit or nuts in between meals. Try to keep a regular sleep-wake schedule and try to exercise daily, particularly in the form of walking, 20-30 minutes a day, if you can.   - Continue the Topamax for migraine, doing well  - Tension headaches: for stress, will continue Prozac - Flexeril at night for jaw clenching - Discussed Ajovy - will start - Started Prozac in addition to the Wellbutrin for stress and "teariness", continue - wellbutrin may help with smoking cravings and we have tried this in the past but stopped, she is still smoking - Denies botox for migraines, not interested - Recommend sleep study for morning headaches, she declines at this time  Meds ordered this encounter  Medications  . topiramate (TOPAMAX) 25 MG tablet    Sig: TAKE 1 TABLET BY MOUTH EVERY MORNING AND 2 TABLETS EVERY EVENING    Dispense:  270 tablet    Refill:  4  . cyclobenzaprine (FLEXERIL) 10 MG tablet    Sig: Take 1 tablet (10 mg total) by mouth at bedtime.    Dispense:  90 tablet    Refill:  4  . FLUoxetine (PROZAC) 20 MG capsule    Sig: Take 1 capsule (20 mg total) by mouth daily.    Dispense:  90 capsule    Refill:  4  . Fremanezumab-vfrm (AJOVY) 225 MG/1.5ML SOAJ    Sig: Inject 225 mg into the skin every 30 (thirty) days.    Dispense:  1 pen    Refill:  11  Naomie Dean, MD  [facility] [address] [place], [place] [address]  Phone [phone] Fax [phone]  A total of 25 minutes was spent face-to-face with this patient. Over half this time was spent on counseling patient on the   1. Chronic migraine without aura without status migrainosus, not intractable    diagnosis and different diagnostic and therapeutic options available.

## 2018-11-25 NOTE — Patient Instructions (Signed)
Continue Topamax, Flexeril, Prozac Start Ajovy  Fremanezumab injection What is this medicine? FREMANEZUMAB (fre ma NEZ ue mab) is used to prevent migraine headaches. This medicine may be used for other purposes; ask your health care provider or pharmacist if you have questions. COMMON BRAND NAME(S): AJOVY What should I tell my health care provider before I take this medicine? They need to know if you have any of these conditions:  an unusual or allergic reaction to fremanezumab, other medicines, foods, dyes, or preservatives  pregnant or trying to get pregnant  breast-feeding How should I use this medicine? This medicine is for injection under the skin. You will be taught how to prepare and give this medicine. Use exactly as directed. Take your medicine at regular intervals. Do not take your medicine more often than directed. It is important that you put your used needles and syringes in a special sharps container. Do not put them in a trash can. If you do not have a sharps container, call your pharmacist or healthcare provider to get one. Talk to your pediatrician regarding the use of this medicine in children. Special care may be needed. Overdosage: If you think you have taken too much of this medicine contact a poison control center or emergency room at once. NOTE: This medicine is only for you. Do not share this medicine with others. What if I miss a dose? If you miss a dose, take it as soon as you can. If it is almost time for your next dose, take only that dose. Do not take double or extra doses. What may interact with this medicine? Interactions are not expected. This list may not describe all possible interactions. Give your health care provider a list of all the medicines, herbs, non-prescription drugs, or dietary supplements you use. Also tell them if you smoke, drink alcohol, or use illegal drugs. Some items may interact with your medicine. What should I watch for while using  this medicine? Tell your doctor or healthcare professional if your symptoms do not start to get better or if they get worse. What side effects may I notice from receiving this medicine? Side effects that you should report to your doctor or health care professional as soon as possible:  allergic reactions like skin rash, itching or hives, swelling of the face, lips, or tongue Side effects that usually do not require medical attention (report these to your doctor or health care professional if they continue or are bothersome):  pain, redness, or irritation at site where injected This list may not describe all possible side effects. Call your doctor for medical advice about side effects. You may report side effects to FDA at 1-800-FDA-1088. Where should I keep my medicine? Keep out of the reach of children. You will be instructed on how to store this medicine. Throw away any unused medicine after the expiration date on the label. NOTE: This sheet is a summary. It may not cover all possible information. If you have questions about this medicine, talk to your doctor, pharmacist, or health care provider.  2020 Elsevier/Gold Standard (2016-12-25 17:22:56)

## 2018-12-15 IMAGING — US US RENAL
1 series · 14 of 25 positions shown · non-contrast
Comparison: CT abdomen and pelvis June 24, 2004

CLINICAL DATA: Acute renal insufficiency with hypokalemia

EXAM:
RENAL / URINARY TRACT ULTRASOUND COMPLETE

[Series 1: us renal · 0.21mm/px · 14 of 50 slices shown]
[im 1/50]
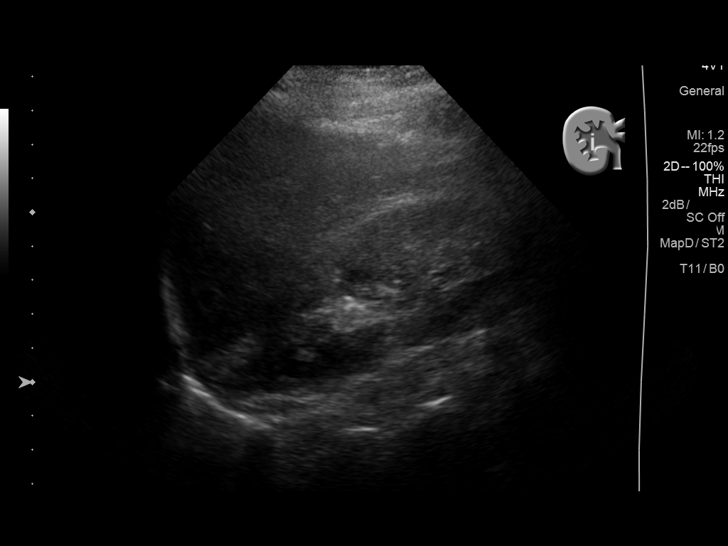
[im 5/50]
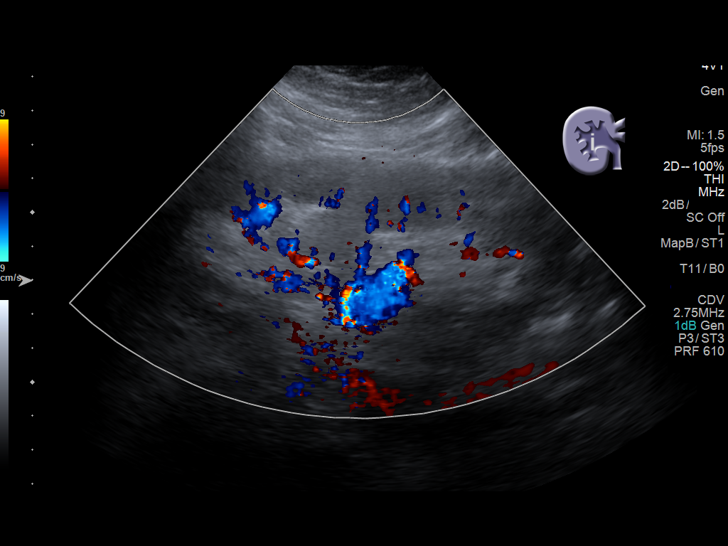
[im 9/50]
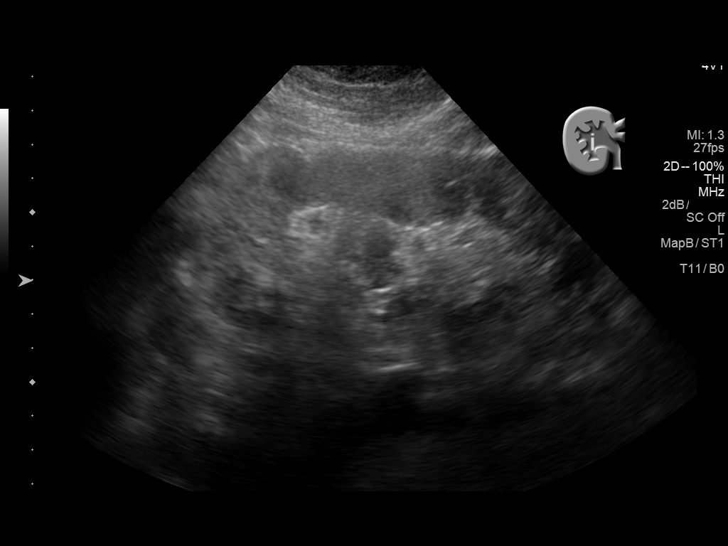
[im 13/50]
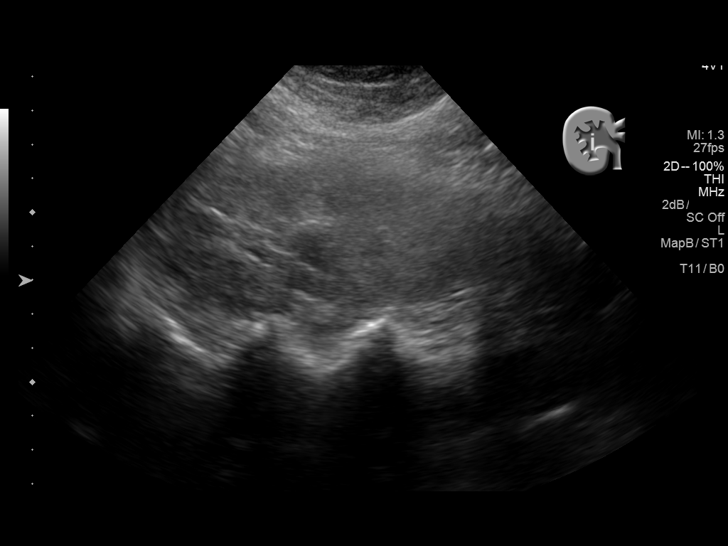
[im 17/50]
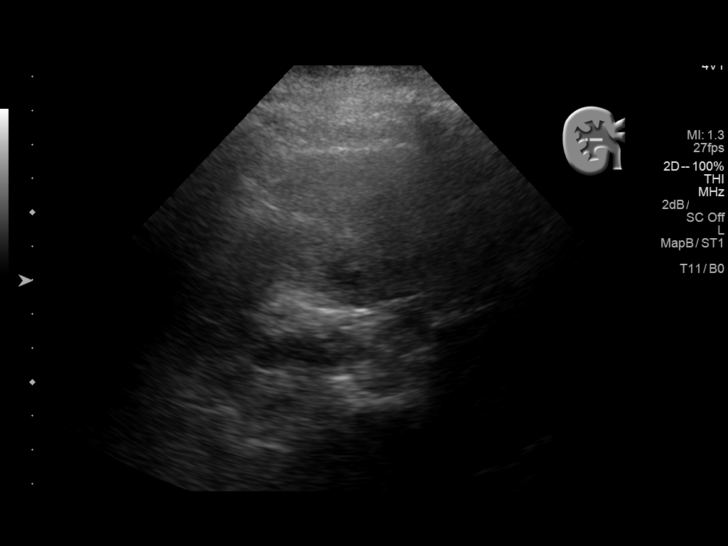
[im 19/50]
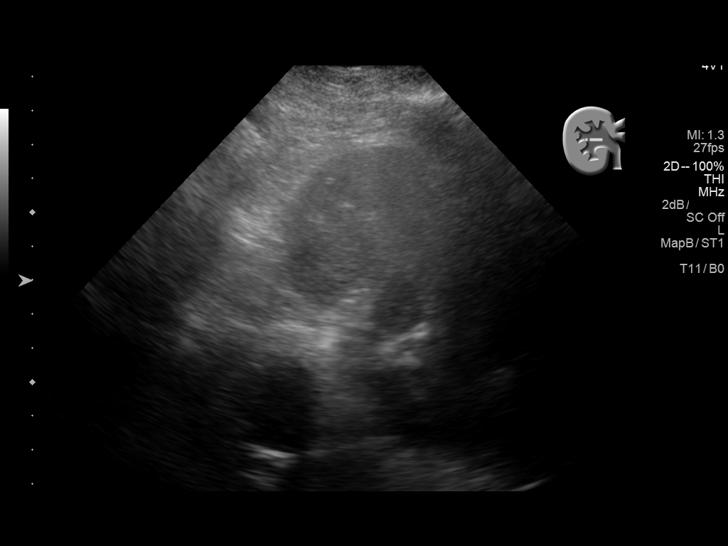
[im 23/50]
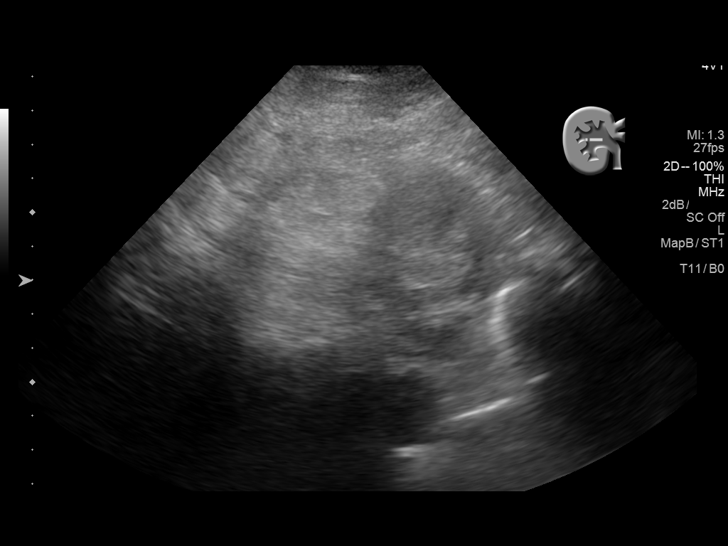
[im 27/50]
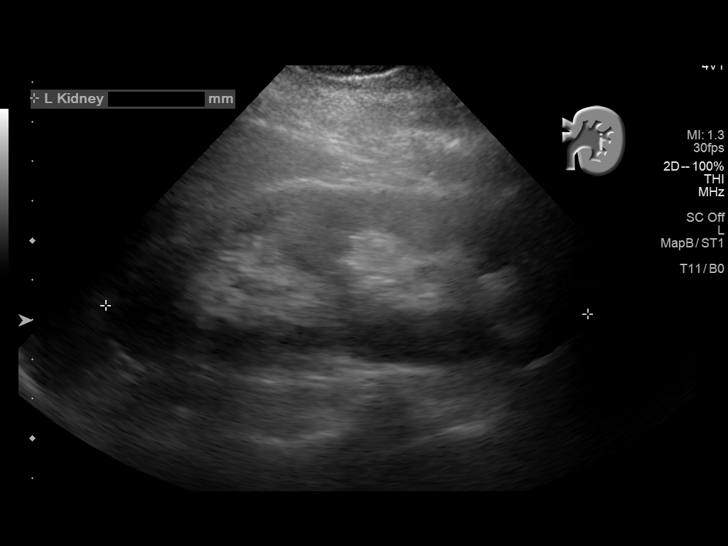
[im 31/50]
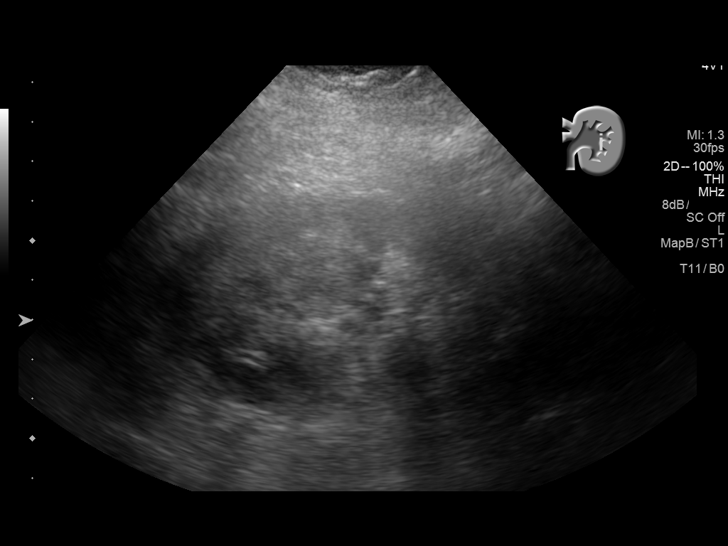
[im 33/50]
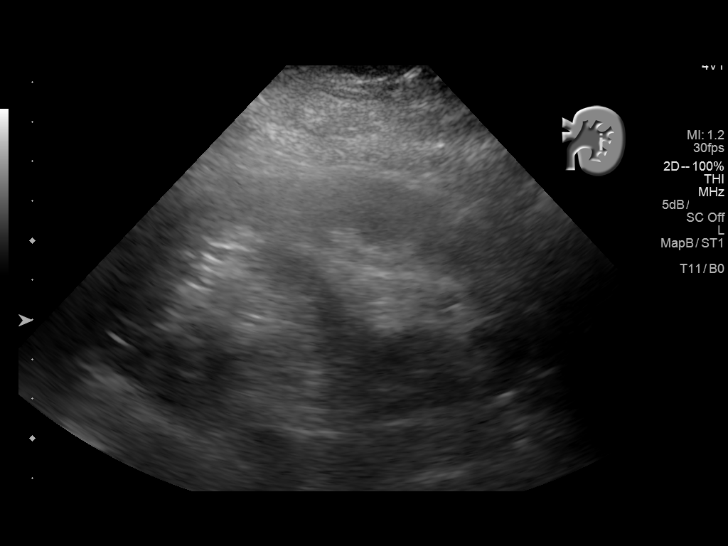
[im 37/50]
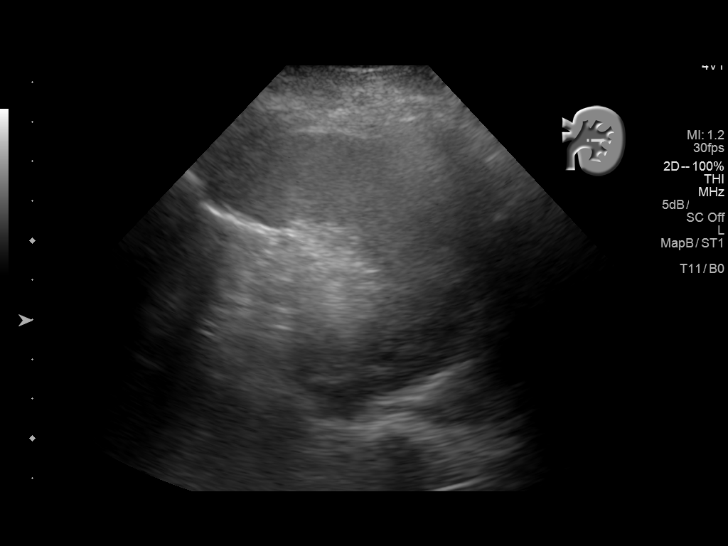
[im 41/50]
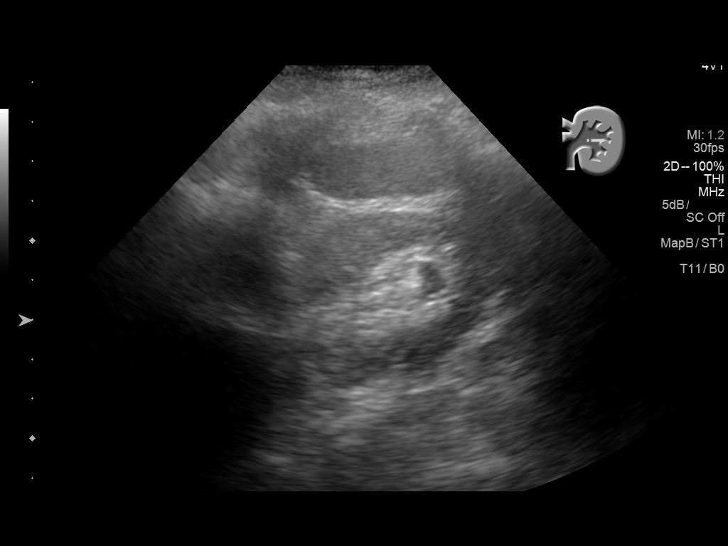
[im 45/50]
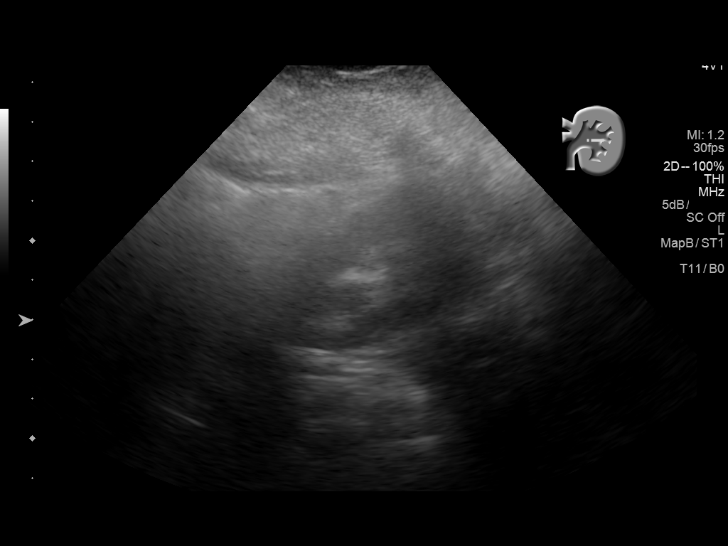
[im 50/50]
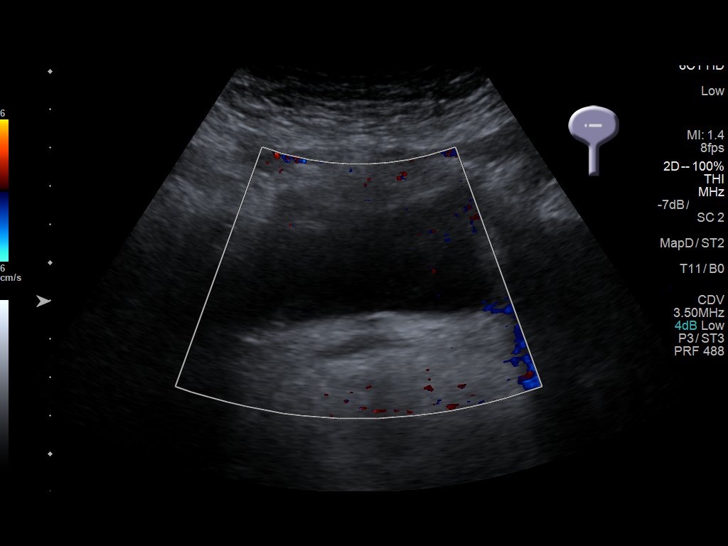

[14 of 25 positions shown; findings below may reference images not displayed]

FINDINGS: Right Kidney:

Length: 12.2 cm. Echogenicity is increased. Renal cortical thickness
is normal. No mass, perinephric fluid, or hydronephrosis visualized.
No sonographically demonstrable calculus or ureterectasis.

Left Kidney:

Length: 12.2 cm. Echogenicity is increased. Renal cortical thickness
is normal. No mass, perinephric fluid, or hydronephrosis visualized.
No sonographically demonstrable calculus or ureterectasis.

Bladder:

Appears normal for degree of bladder distention.
IMPRESSION: Increase in renal echogenicity bilaterally, a finding felt to
represent medical renal disease. No obstructing focus in either
kidney. Renal cortical thickness normal. Study otherwise
unremarkable.

## 2019-01-07 ENCOUNTER — Encounter: Payer: Self-pay | Admitting: Gynecology

## 2019-02-27 ENCOUNTER — Other Ambulatory Visit: Payer: Self-pay | Admitting: *Deleted

## 2019-02-27 MED ORDER — ALPRAZOLAM 0.25 MG PO TABS: 0.2500 mg | ORAL_TABLET | Freq: Every evening | ORAL | 1 refills | Status: DC | PRN

## 2019-02-27 NOTE — Telephone Encounter (Signed)
Received paper request for Xanax refill at [facility]. Spoke with Dr. Jaynee Eagles. Ok to provide 2 refills. Per [place] registry, pt last filled 09/07/2018 #15. Ordered by Dr. Jaynee Eagles 03/12/18.   Refill sent to Dr. Jaynee Eagles to e-scribe.

## 2019-06-04 ENCOUNTER — Encounter: Payer: Self-pay | Admitting: Internal Medicine

## 2019-06-20 ENCOUNTER — Ambulatory Visit: Payer: BC Managed Care – PPO | Admitting: Gastroenterology

## 2019-06-26 ENCOUNTER — Encounter: Payer: Self-pay | Admitting: Internal Medicine

## 2019-06-26 ENCOUNTER — Other Ambulatory Visit: Payer: Self-pay

## 2019-06-26 ENCOUNTER — Encounter: Payer: Self-pay | Admitting: Gastroenterology

## 2019-06-26 ENCOUNTER — Ambulatory Visit: Payer: BC Managed Care – PPO | Admitting: Gastroenterology

## 2019-06-26 DIAGNOSIS — K580 Irritable bowel syndrome with diarrhea: Secondary | ICD-10-CM | POA: Diagnosis not present

## 2019-06-26 DIAGNOSIS — K589 Irritable bowel syndrome without diarrhea: Secondary | ICD-10-CM | POA: Insufficient documentation

## 2019-06-26 DIAGNOSIS — Z86010 Personal history of colonic polyps: Secondary | ICD-10-CM | POA: Diagnosis not present

## 2019-06-26 NOTE — Patient Instructions (Signed)
We will arrange a colonoscopy in the near future.  Please keep a food diary in the meantime as you have in the past.  We can see you in August for routine follow-up. Please call if any worsening in the meantime!  It was a pleasure to see you today. I want to create trusting relationships with patients to provide genuine, compassionate, and quality care. I value your feedback. If you receive a survey regarding your visit,  I greatly appreciate you taking time to fill this out.   Gelene Mink, PhD, ANP-BC [facility]

## 2019-06-26 NOTE — Progress Notes (Signed)
Primary Care Physician:  Benita Stabile, MD  Referring Physician: Dr. Margo Aye  Primary Gastroenterologist:  Dr. Jena Gauss   Chief Complaint  Patient presents with  . Irritable Bowel Syndrome    diarrhea depends on what she eats  . Consult    TCS    HPI:   Paula Trevino is a 64 y.o. female presenting today at the request of Dr. Margo Aye for screening colonoscopy. She reports last one in the early 2000s by Dr. Jena Gauss with polyps. Records are not available in epic. Chronic history of diarrhea felt to be related to IBS per patient.     Hasn't been to a PCP in 6 years. Mother is 82 and patient helps take care of her. No rectal bleeding. No abdominal pain. Certain foods she eats will cause diarrhea. Feels stress worsens symptoms. Eats a lot of salads, which can worsen diarrhea. Gained weight during Covid. States a long time talked about taking meds with Dr. Jena Gauss but wanted to hold off at that time. Trigger foods: salad, coffee (drinks copious amounts of coffee), white sauce from [place] restaurants, hot dogs, cabbage, almonds. Will have the urgency with these types of foods. Kept a food diary in the past. Doesn't drink milk. Uses half and half in coffee. Not lots of ice cream. Uses lactose free products as family is allergic to milk. Does eat a lot of cheese. Episodes at least once a week if not more. Stools are more mush-like in general and sometimes formed. If piles up on food, will go right through her.   Has had chronic intermittent urgency of stool even prior to cholecystectomy in 1997.   Past Medical History:  Diagnosis Date  . Arthritis   . Colon polyps   . Difficulty sleeping   . E coli enteritis   . IBS (irritable bowel syndrome)   . Migraines   . Norovirus 06/2017    Past Surgical History:  Procedure Laterality Date  . CESAREAN SECTION  1991  . CHOLECYSTECTOMY  1997  . PARS PLANA VITRECTOMY W/ REPAIR OF MACULAR HOLE    . SHOULDER SURGERY      Current Outpatient Medications    Medication Sig Dispense Refill  . Acetaminophen (TYLENOL PO) Take by mouth as needed.    . ALPRAZolam (XANAX) 0.25 MG tablet Take 1 tablet (0.25 mg total) by mouth at bedtime as needed for sleep. 15 tablet 1  . cyclobenzaprine (FLEXERIL) 10 MG tablet Take 1 tablet (10 mg total) by mouth at bedtime. 90 tablet 4  . eletriptan (RELPAX) 40 MG tablet take 1 tablet by mouth AT ONSET OF HEADACHE MAY REPEAT IN 2 HOURS IF HEADACHE RECURS 10 tablet 11  . FLUoxetine (PROZAC) 20 MG capsule Take 1 capsule (20 mg total) by mouth daily. 90 capsule 4  . Fremanezumab-vfrm (AJOVY) 225 MG/1.5ML SOAJ Inject 225 mg into the skin every 30 (thirty) days. 1 pen 11  . topiramate (TOPAMAX) 25 MG tablet TAKE 1 TABLET BY MOUTH EVERY MORNING AND 2 TABLETS EVERY EVENING 270 tablet 4   No current facility-administered medications for this visit.    Allergies as of 06/26/2019 - Review Complete 06/26/2019  Allergen Reaction Noted  . Celecoxib Hives 10/01/2014  . Codeine Nausea And Vomiting 08/30/2012  . Morphine Other (See Comments) 10/01/2014  . Sumatriptan Other (See Comments) 10/01/2014  . Sulfa antibiotics Rash and Hives 08/29/2012    Family History  Problem Relation Age of Onset  . Hypertension Mother   . Cancer  Mother   . Heart disease Father   . Stroke Maternal Grandmother   . Heart disease Paternal Grandmother   . Cancer Paternal Grandfather        Stomach  . Migraines Sister   . Migraines Maternal Aunt        great aunt  . Migraines Daughter   . Other Daughter        severe nerve damage, chronic pain  . Colon cancer Neg Hx     Social History   Socioeconomic History  . Marital status: Widowed    Spouse name: Not on file  . Number of children: 1  . Years of education: BS  . Highest education level: Not on file  Occupational History  . Occupation: Retired    Comment: Pharmacist, hospital, retired in 2014  Tobacco Use  . Smoking status: Former Smoker    Packs/day: 0.50    Types: Cigarettes  .  Smokeless tobacco: Never Used  Substance and Sexual Activity  . Alcohol use: Yes    Comment: occasionally wine  . Drug use: No  . Sexual activity: Never    Birth control/protection: Post-menopausal  Other Topics Concern  . Not on file  Social History Narrative   Patient lives at home alone.   Caffeine Use: 1 soda and a few cups of coffee daily   Right handed   Social Determinants of Health   Financial Resource Strain:   . Difficulty of Paying Living Expenses:   Food Insecurity:   . Worried About Charity fundraiser in the Last Year:   . Arboriculturist in the Last Year:   Transportation Needs:   . Film/video editor (Medical):   Marland Kitchen Lack of Transportation (Non-Medical):   Physical Activity:   . Days of Exercise per Week:   . Minutes of Exercise per Session:   Stress:   . Feeling of Stress :   Social Connections:   . Frequency of Communication with Friends and Family:   . Frequency of Social Gatherings with Friends and Family:   . Attends Religious Services:   . Active Member of Clubs or Organizations:   . Attends Archivist Meetings:   Marland Kitchen Marital Status:   Intimate Partner Violence:   . Fear of Current or Ex-Partner:   . Emotionally Abused:   Marland Kitchen Physically Abused:   . Sexually Abused:     Review of Systems: Gen: Denies any fever, chills, fatigue, weight loss, lack of appetite.  CV: Denies chest pain, heart palpitations, peripheral edema, syncope.  Resp: Denies shortness of breath at rest or with exertion. Denies wheezing or cough.  GI: see HPI GU : Denies urinary burning, urinary frequency, urinary hesitancy MS: Denies joint pain, muscle weakness, cramps, or limitation of movement.  Derm: Denies rash, itching, dry skin Psych: Denies depression, anxiety, memory loss, and confusion Heme: Denies bruising, bleeding, and enlarged lymph nodes.  Physical Exam: BP 115/72   Pulse 84   Temp (!) 97.2 F (36.2 C) (Oral)   Ht 5\' 1"  (1.549 m)   Wt 150 lb 6.4 oz  (68.2 kg)   BMI 28.42 kg/m  General:   Alert and oriented. Pleasant and cooperative. Well-nourished and well-developed.  Head:  Normocephalic and atraumatic. Eyes:  Without icterus, sclera clear and conjunctiva pink.  Ears:  Normal auditory acuity. Mouth:  Mask in place Lungs:  Clear to auscultation bilaterally. No wheezes, rales, or rhonchi. No distress.  Heart:  S1, S2 present without murmurs appreciated.  Abdomen:  +BS, soft, non-tender and non-distended. No HSM noted. No guarding or rebound. No masses appreciated.  Rectal:  Deferred  Msk:  Symmetrical without gross deformities. Normal posture. Extremities:  Without edema. Neurologic:  Alert and  oriented x4;  grossly normal neurologically. Skin:  Intact without significant lesions or rashes. Psych:  Alert and cooperative. Normal mood and affect.  ASSESSMENT: Paula Trevino is a 64 y.o. female presenting today with history of colon polyps in 2005/2006 but records not available in our system, overdue for surveillance. She has a chronic history of intermittent fecal urgency depending on dietary choices, present even prior to cholecystectomy. She is able to name certain triggers related to this. No concerning signs/symptoms.  Will pursue surveillance colonoscopy in the future. I have asked her to keep a food diary in the meantime. Would likely have most success with dietary avoidance of food triggers. Would add Bentyl or Levsin if any worsening.    PLAN:  Proceed with TCS with Dr. Jena Gauss in near future: the risks, benefits, and alternatives have been discussed with the patient in detail. The patient states understanding and desires to proceed. Propofol due to polypharmacy  Return in August for routine follow-up  Keep food diary in interim  Gelene Mink, PhD, [facility]

## 2019-07-08 ENCOUNTER — Telehealth: Payer: Self-pay | Admitting: *Deleted

## 2019-07-08 NOTE — Telephone Encounter (Signed)
Patient was seen 3/18 and was to call with dates for TCS with propofol with RMR DX surveillance, pt requests small volume prep. No call back. Letter mailed.

## 2019-07-27 ENCOUNTER — Other Ambulatory Visit: Payer: Self-pay | Admitting: Neurology

## 2019-09-24 ENCOUNTER — Other Ambulatory Visit: Payer: Self-pay | Admitting: Neurology

## 2019-09-24 MED ORDER — ALPRAZOLAM 0.25 MG PO TABS: 0.2500 mg | ORAL_TABLET | Freq: Every evening | ORAL | 1 refills | Status: DC | PRN

## 2019-10-01 ENCOUNTER — Ambulatory Visit: Payer: BC Managed Care – PPO | Admitting: Gastroenterology

## 2019-11-25 ENCOUNTER — Ambulatory Visit: Payer: BC Managed Care – PPO | Admitting: Gastroenterology

## 2019-11-25 NOTE — Progress Notes (Signed)
[facility]   Provider:  Dr Lucia Gaskins Referring Provider: Assunta Found, MD Primary Care Physician:  [facility]  CC: Migraine  Interval history 11/26/2019: Baseline:  lingering daily headaches, at last appointment was having daily migraines. She is not waking up with the headaches every morning but still lingering 15 headache days a month, 3-4 migraines a month, zyrtec has helped, she has had some improvement with Ajovy, she had an excruciating headache after the covid shot, We discussed triptans and the new medications such as nurtec. In the past recommended sleep evaluation was recommended and she has declined. She has a lot of stress in her life and feel this affects her. She is on Topamax. She declines botox. The headaches are mild 1/2 the month. Asked her to keep a migraine diary. Her migraines start on the right, spreads to the back, pulsating/pounding, light and sound sensitivity, nausea. The other headaches are more pressure. Will give samples today of nurtec. She wants to stay on the ajovy. She is still smoking, she has a lot of stress. She has cut back.  Interval history 11/25/2018: patient here for follow up of headaches and migraines last seen approx. One year ago.She has been on topamax with good results.  Recommended sleep eval for morning headaches and she declined. For her smoking I started her on Wellbutrin and recommended smoking counseling in the past.  Since July her migraines are worse. She wakes with headaches and through the day she is getting daily headaches and migraines. She has daily dull headaches. Discussed management, will continue Topamax and Eletriptan. Significant history of most women on her mother's side and daughter.   Interval history 11/20/2017: Patient is here, follow up on migraines. Recommended sleep eval but she never complied. She is on Topamax, relpax, flexeril, still smoking. There is stress with her daughter who has pain  after c-section. They had to go to [place] to find someone to help. The Topamax is helping, she has a "tense head" moreso to stress. Discussed, will start Prozac in addition to the wellbutrin.   Interval history 10/03/2016: Patient is here for yearly follow-up of migraines. She has done well on Topamax, Relpax, Flexeril. Recommend sleep eval but she never complied. Had a long discussion about smoking cessation, started her on Wellbutrin and recommended hypnotist.  Not much has changed. She went for 8 months without smoking, she had a relapse in April but quit in the last week and back on the wellbutrin. Worse in the spring. She has hadaches often during the month little mild headache and severe migraines are once every 2 months but monthly 4 migraines but needs . Relpax helps when she takes it for migraines. Takes tylenol occasionally, she really don;t keep track. Asked her to keep a migraine diary. 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  Interval history 10/04/2015:  Here for follow up of migraines. She never had her sleep eval, discussed untreated sleep apnea can cause increased risk of stroke and other sequelae. Her migraines are feeling better. She is terrified that she has cancer, she has been smoking so long, she wants to quit. Discussed at length. Will try her on Wellbutrin. Recommended hypnotist who performs this for smoking. Also recommended therapy for smoking cessation. Discussed other medications and side effects, chantix for example. Decided on Wellbutrin. Discussed side effects as detailed in the patient instructions. Asked  her to use my chart to keep me informed of her progress. She clenches her jaw at night, may be stress, has seen a dentist and can't afford the mouthguard.   Interval History 10/01/2014: STEPHANNY TSUTSUI is a 64 y.o. female here as a follow up for migraine. She is  transitioning to my care.She is waking up with headachesand thinks she is clenching her teeth or snoring. She is going to the dentist to see if teeth clenching is causing her headaches. She feels lightheaded and some tingling every once in a while but otherwise no side effects from the Topiramate. She takes one Topiramate the morning and 2 at night. Relpax helps with acute management. Snores at night. Excessive daytime sleepiness. Morning headache. ESS 14.  Interval History 03/27/2014: When she is not drinking enough water she gets burning urination. She is not sleeping well. She used to take Xanax. She is not getting any migraines. Has some tingling in the fingers occasionally but otherwise doing well. She falls asleep but she wakes up at midnight and can't go to sleep for hours. Doesn't know why she is waking up, possibly to urinate then can't go back to sleep. She used to take Xanax every night but got off of it. She is not a good sleeper.   09/29/13 (LL): Since last visit 6 months ago, headaches have picked up again in frequency as weather has gotten hotter. Side effects of TPX diminished. She is having more frequent headaches that have a more tight, pressure quality, but not many severe headaches. Overall much better than before going on TPX. She has not had great response from Sumatriptan; always needing second dose and not fully relieving headache. Her daughter has had good response from Relpax.  03/10/13 (LL): Patient returns for 2 month follow up for Migraines, they have greatly improved on TPX. She is having some tingling in fingers and occasional facial numbness, balance seems off, nausea first few weeks (resolved). She is happy not to have severe headache though. Still having 1-2 milder headaches, treating with ibuprofen. She has not had to use Sumatriptan. She is happy with current treatment.   01/07/13 (VP): 64 year old right-handed female here for a evaluation of headaches,  recurrent since 2012, but worsened since July 2014. Patient was in her 72s, she had developed right-sided headaches with photophobia, pulsating and throbbing sensation. She took over-the-counter medications with good relief. However, she was not officially diagnosed with migraine. By late 30s and 40s, she had stopped having headaches. 2012 headaches returned. Since summer 2014, headaches have been significantly worsen. She had recurrent right-sided throbbing severe headaches with retro-orbital pain, nausea, photophobia. She was sensitive to smells. She's been taking Excedrin Migraine, Benadryl, Motrin, Tylenol as a for headaches. Patient went to the emergency room one time because of severe headaches, received headache cocktails and CT scan head which are unremarkable. Since then she's had lingering daily headaches. She has 2-3 severe headaches per week. Strong family history of migraine headaches in patient's sister, daughter, maternal great aunt. Patient is never tried anti-migraine medications in the past. No specific triggering factors  Review of Systems: Patient complains of symptoms per HPI as well as the following symptoms: snoring, stress  Pertinent negatives per HPI. All others negative.   Social History   Socioeconomic History  . Marital status: Widowed    Spouse name: Not on file  . Number of children: 1  . Years of education: BS  . Highest education level: Not on file  Occupational  History  . Occupation: Retired    Comment: Runner, broadcasting/film/video, retired in 2014  Tobacco Use  . Smoking status: Former Smoker    Packs/day: 0.50    Types: Cigarettes  . Smokeless tobacco: Never Used  Vaping Use  . Vaping Use: Never used  Substance and Sexual Activity  . Alcohol use: Yes    Comment: occasionally wine  . Drug use: No  . Sexual activity: Never    Birth control/protection: Post-menopausal  Other Topics Concern  . Not on file  Social History Narrative   Patient lives at home alone.    Caffeine Use: 1 soda and a few cups of coffee daily   Right handed   Social Determinants of Health   Financial Resource Strain:   . Difficulty of Paying Living Expenses:   Food Insecurity:   . Worried About Programme researcher, broadcasting/film/video in the Last Year:   . Barista in the Last Year:   Transportation Needs:   . Freight forwarder (Medical):   Marland Kitchen Lack of Transportation (Non-Medical):   Physical Activity:   . Days of Exercise per Week:   . Minutes of Exercise per Session:   Stress:   . Feeling of Stress :   Social Connections:   . Frequency of Communication with Friends and Family:   . Frequency of Social Gatherings with Friends and Family:   . Attends Religious Services:   . Active Member of Clubs or Organizations:   . Attends Banker Meetings:   Marland Kitchen Marital Status:   Intimate Partner Violence:   . Fear of Current or Ex-Partner:   . Emotionally Abused:   Marland Kitchen Physically Abused:   . Sexually Abused:     Family History  Problem Relation Age of Onset  . Hypertension Mother   . Cancer Mother   . Heart disease Father   . Stroke Maternal Grandmother   . Heart disease Paternal Grandmother   . Cancer Paternal Grandfather        Stomach  . Migraines Sister   . Migraines Maternal Aunt        great aunt  . Migraines Daughter   . Other Daughter        severe nerve damage, chronic pain  . Colon cancer Neg Hx     Past Medical History:  Diagnosis Date  . Arthritis   . Colon polyps   . Difficulty sleeping   . E coli enteritis   . IBS (irritable bowel syndrome)   . Migraines   . Norovirus 06/2017    Past Surgical History:  Procedure Laterality Date  . CESAREAN SECTION  1991  . CHOLECYSTECTOMY  1997  . PARS PLANA VITRECTOMY W/ REPAIR OF MACULAR HOLE    . SHOULDER SURGERY      Current Outpatient Medications  Medication Sig Dispense Refill  . Acetaminophen (TYLENOL PO) Take by mouth as needed.    . ALPRAZolam (XANAX) 0.25 MG tablet Take 1 tablet (0.25 mg  total) by mouth at bedtime as needed for sleep. 15 tablet 1  . cyclobenzaprine (FLEXERIL) 10 MG tablet Take 1 tablet (10 mg total) by mouth at bedtime. 90 tablet 4  . eletriptan (RELPAX) 40 MG tablet May repeat in 2 hours if headache persists or recurs. 9 tablet 11  . FLUoxetine (PROZAC) 20 MG capsule Take 1 capsule (20 mg total) by mouth daily. 90 capsule 4  . Fremanezumab-vfrm (AJOVY) 225 MG/1.5ML SOAJ Inject 225 mg into the skin every 30 (thirty)  days. 4.5 mL 6  . topiramate (TOPAMAX) 25 MG tablet TAKE 1 TABLET BY MOUTH EVERY MORNING AND 2 TABLETS EVERY EVENING 270 tablet 4  . Rimegepant Sulfate (NURTEC) 75 MG TBDP Take 75 mg by mouth daily as needed. For migraines. Take as close to onset of migraine as possible. One daily maximum. 8 tablet 0   No current facility-administered medications for this visit.    Allergies as of 11/26/2019 - Review Complete 11/26/2019  Allergen Reaction Noted  . Celecoxib Hives 10/01/2014  . Codeine Nausea And Vomiting 08/30/2012  . Morphine Other (See Comments) 10/01/2014  . Sumatriptan Other (See Comments) 10/01/2014  . Sulfa antibiotics Rash and Hives 08/29/2012    Vitals: BP 109/70   Pulse 71   Ht 5\' 1"  (1.549 m)   Wt 142 lb (64.4 kg)   BMI 26.83 kg/m  Last Weight:  Wt Readings from Last 1 Encounters:  11/26/19 142 lb (64.4 kg)   Last Height:   Ht Readings from Last 1 Encounters:  11/26/19 5\' 1"  (1.549 m)  Physical exam: Exam: Gen: NAD, conversant, well nourised, obese, well groomed                     CV: RRR, no MRG. No Carotid Bruits. No peripheral edema, warm, nontender Eyes: Conjunctivae clear without exudates or hemorrhage  Neuro: Detailed Neurologic Exam  Speech:    Speech is normal; fluent and spontaneous with normal comprehension.  Cognition:    The patient is oriented to person, place, and time;     recent and remote memory intact;     language fluent;     normal attention, concentration,     fund of knowledge Cranial  Nerves:    The pupils are equal, round, and reactive to light. The fundi are normal and spontaneous venous pulsations are present. Visual fields are full to finger confrontation. Extraocular movements are intact. Trigeminal sensation is intact and the muscles of mastication are normal. The face is symmetric. The palate elevates in the midline. Hearing intact. Voice is normal. Shoulder shrug is normal. The tongue has normal motion without fasciculations.   Coordination:    Normal finger to nose and heel to shin. Normal rapid alternating movements.   Gait:    Heel-toe and tandem gait are normal.   Motor Observation:    No asymmetry, no atrophy, and no involuntary movements noted. Tone:    Normal muscle tone.    Posture:    Posture is normal. normal erect    Strength:    Strength is V/V in the upper and lower limbs.      Sensation: intact to LT     Reflex Exam:      Assessment and plan: 64 y.o. year old female here for f/up of headache. She is on Topamax and Relpax.  She also has bruxism. Marion Downerjvy has helped from daily headaches and migraines to mild headaches 1/2 the month and only 3-4 migraines a month. She has a lot of stress.  She declines botox.Continue Topamax.  Smoking: discussed cessation  Flexeril at night for clenching at night  Morning headaches: get a mouth guard, see dentist. She declines imaging and sleep study  Topamax has made mouth dry. 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 Offered PT for TMJ/Bruxism she declines  Try nurtec as needed if she wants we can send a prescription for her  Remember  to drink plenty of fluid, eat healthy meals and do not skip any meals. Try to eat protein with a every meal and eat a healthy snack such as fruit or nuts in between meals. Try to keep a regular sleep-wake schedule and try to exercise daily, particularly in the form of walking, 20-30 minutes a day, if you  can.   - Continue the Topamax for migraine, doing well  - Tension headaches: for stress, will continue Prozac - Flexeril at night for jaw clenching - Discussed Ajovy - will start - Started Prozac in addition to the Wellbutrin for stress and "teariness", continue - wellbutrin may help with smoking cravings and we have tried this in the past but stopped, she is still smoking - Denies botox for migraines, not interested - Recommend sleep study for morning headaches, she declines at this time  Meds ordered this encounter  Medications  . Rimegepant Sulfate (NURTEC) 75 MG TBDP    Sig: Take 75 mg by mouth daily as needed. For migraines. Take as close to onset of migraine as possible. One daily maximum.    Dispense:  8 tablet    Refill:  0  . Fremanezumab-vfrm (AJOVY) 225 MG/1.5ML SOAJ    Sig: Inject 225 mg into the skin every 30 (thirty) days.    Dispense:  4.5 mL    Refill:  6    3 month supply if insurance will pay otherwise dispense monthly  . FLUoxetine (PROZAC) 20 MG capsule    Sig: Take 1 capsule (20 mg total) by mouth daily.    Dispense:  90 capsule    Refill:  4  . cyclobenzaprine (FLEXERIL) 10 MG tablet    Sig: Take 1 tablet (10 mg total) by mouth at bedtime.    Dispense:  90 tablet    Refill:  4  . topiramate (TOPAMAX) 25 MG tablet    Sig: TAKE 1 TABLET BY MOUTH EVERY MORNING AND 2 TABLETS EVERY EVENING    Dispense:  270 tablet    Refill:  4  . eletriptan (RELPAX) 40 MG tablet    Sig: May repeat in 2 hours if headache persists or recurs.    Dispense:  9 tablet    Refill:  11    I spent more than 30  minutes of face-to-face and non-face-to-face time with patient on the  1. Chronic migraine without aura without status migrainosus, not intractable    diagnosis.  This included previsit chart review, lab review, study review, order entry, electronic health record documentation, patient education on the different diagnostic and therapeutic options, counseling and coordination  of care, risks and benefits of management, compliance, or risk factor reduction  Naomie Dean, MD  [facility] [address] [place], [place] [address]  Phone [phone] Fax [phone]

## 2019-11-26 ENCOUNTER — Encounter: Payer: Self-pay | Admitting: Neurology

## 2019-11-26 ENCOUNTER — Other Ambulatory Visit: Payer: Self-pay

## 2019-11-26 ENCOUNTER — Ambulatory Visit: Payer: BC Managed Care – PPO | Admitting: Neurology

## 2019-11-26 VITALS — BP 109/70 | HR 71 | Ht 61.0 in | Wt 142.0 lb

## 2019-11-26 DIAGNOSIS — G43709 Chronic migraine without aura, not intractable, without status migrainosus: Secondary | ICD-10-CM

## 2019-11-26 MED ORDER — ELETRIPTAN HYDROBROMIDE 40 MG PO TABS: ORAL_TABLET | ORAL | 11 refills | Status: DC

## 2019-11-26 MED ORDER — NURTEC 75 MG PO TBDP: 75.0000 mg | ORAL_TABLET | Freq: Every day | ORAL | 0 refills | Status: DC | PRN

## 2019-11-26 MED ORDER — FLUOXETINE HCL 20 MG PO CAPS: 20.0000 mg | ORAL_CAPSULE | Freq: Every day | ORAL | 4 refills | Status: DC

## 2019-11-26 MED ORDER — CYCLOBENZAPRINE HCL 10 MG PO TABS: 10.0000 mg | ORAL_TABLET | Freq: Every day | ORAL | 4 refills | Status: DC

## 2019-11-26 MED ORDER — AJOVY 225 MG/1.5ML ~~LOC~~ SOAJ: 225.0000 mg | SUBCUTANEOUS | 6 refills | Status: DC

## 2019-11-26 MED ORDER — TOPIRAMATE 25 MG PO TABS: ORAL_TABLET | ORAL | 4 refills | Status: DC

## 2019-11-26 NOTE — Patient Instructions (Signed)
Rimegepant oral dissolving tablet What is this medicine? RIMEGEPANT (ri ME je pant) is used to treat migraine headaches with or without aura. An aura is a strange feeling or visual disturbance that warns you of an attack. It is not used to prevent migraines. This medicine may be used for other purposes; ask your health care provider or pharmacist if you have questions. COMMON BRAND NAME(S): NURTEC ODT What should I tell my health care provider before I take this medicine? They need to know if you have any of these conditions:  kidney disease  liver disease  an unusual or allergic reaction to rimegepant, other medicines, foods, dyes, or preservatives  pregnant or trying to get pregnant  breast-feeding How should I use this medicine? Take the medicine by mouth. Follow the directions on the prescription label. Leave the tablet in the sealed blister pack until you are ready to take it. With dry hands, open the blister and gently remove the tablet. If the tablet breaks or crumbles, throw it away and take a new tablet out of the blister pack. Place the tablet in the mouth and allow it to dissolve, and then swallow. Do not cut, crush, or chew this medicine. You do not need water to take this medicine. Talk to your pediatrician about the use of this medicine in children. Special care may be needed. Overdosage: If you think you have taken too much of this medicine contact a poison control center or emergency room at once. NOTE: This medicine is only for you. Do not share this medicine with others. What if I miss a dose? This does not apply. This medicine is not for regular use. What may interact with this medicine? This medicine may interact with the following medications:  certain medicines for fungal infections like fluconazole, itraconazole  rifampin This list may not describe all possible interactions. Give your health care provider a list of all the medicines, herbs, non-prescription drugs,  or dietary supplements you use. Also tell them if you smoke, drink alcohol, or use illegal drugs. Some items may interact with your medicine. What should I watch for while using this medicine? Visit your health care professional for regular checks on your progress. Tell your health care professional if your symptoms do not start to get better or if they get worse. What side effects may I notice from receiving this medicine? Side effects that you should report to your doctor or health care professional as soon as possible:  allergic reactions like skin rash, itching or hives; swelling of the face, lips, or tongue Side effects that usually do not require medical attention (report these to your doctor or health care professional if they continue or are bothersome):  nausea This list may not describe all possible side effects. Call your doctor for medical advice about side effects. You may report side effects to FDA at 1-800-FDA-1088. Where should I keep my medicine? Keep out of the reach of children. Store at room temperature between 15 and 30 degrees C (59 and 86 degrees F). Throw away any unused medicine after the expiration date. NOTE: This sheet is a summary. It may not cover all possible information. If you have questions about this medicine, talk to your doctor, pharmacist, or health care provider.  2020 Elsevier/Gold Standard (2018-06-10 00:21:31)  

## 2019-12-10 HISTORY — PX: CATARACT EXTRACTION: SUR2

## 2020-01-27 ENCOUNTER — Ambulatory Visit: Payer: BC Managed Care – PPO | Admitting: Gastroenterology

## 2020-01-27 ENCOUNTER — Encounter: Payer: Self-pay | Admitting: Gastroenterology

## 2020-01-27 ENCOUNTER — Other Ambulatory Visit: Payer: Self-pay

## 2020-01-27 ENCOUNTER — Telehealth: Payer: Self-pay | Admitting: Internal Medicine

## 2020-01-27 ENCOUNTER — Telehealth: Payer: Self-pay | Admitting: *Deleted

## 2020-01-27 VITALS — BP 116/79 | HR 79 | Temp 97.9°F | Ht 61.0 in | Wt 142.6 lb

## 2020-01-27 DIAGNOSIS — Z86010 Personal history of colonic polyps: Secondary | ICD-10-CM | POA: Diagnosis not present

## 2020-01-27 NOTE — Telephone Encounter (Signed)
Called pt, LMOVM to schedule TCS with Dr. Jena Gauss, Jonne Ply 2, WANTS LOW volume prep

## 2020-01-27 NOTE — Progress Notes (Signed)
Referring Provider: Benita Stabile, MD Primary Care Physician:  Benita Stabile, MD Primary Gastroenterologist: Dr. Jena Gauss   Chief Complaint  Patient presents with  . hx polyps    HPI:   Paula Trevino is a 64 y.o. female presenting today with a history of colon polyps in 2005/2006 but records unavailable, overdue for surveillance. History of chronic intermittent fecal urgency depending on dietary choices, present even prior to cholecystectomy. Seen in March 2021 to arrange colonoscopy but had to postpone this.   Has urgency to have BM depending on what she eats. Tries not to skip meals, as this makes it worse. Salads trigger her. Chronic. No rectal bleeding. Just go over a virus last week but doing better. No abdominal pain. Appetite is good. No dysphagia.    Past Medical History:  Diagnosis Date  . Arthritis   . Colon polyps   . Difficulty sleeping   . E coli enteritis   . IBS (irritable bowel syndrome)   . Migraines   . Norovirus 06/2017    Past Surgical History:  Procedure Laterality Date  . CESAREAN SECTION  1991  . CHOLECYSTECTOMY  1997  . PARS PLANA VITRECTOMY W/ REPAIR OF MACULAR HOLE    . SHOULDER SURGERY      Current Outpatient Medications  Medication Sig Dispense Refill  . Acetaminophen (TYLENOL PO) Take by mouth as needed.    . ALPRAZolam (XANAX) 0.25 MG tablet Take 1 tablet (0.25 mg total) by mouth at bedtime as needed for sleep. 15 tablet 1  . cyclobenzaprine (FLEXERIL) 10 MG tablet Take 1 tablet (10 mg total) by mouth at bedtime. (Patient taking differently: Take 10 mg by mouth at bedtime. As needed) 90 tablet 4  . eletriptan (RELPAX) 40 MG tablet May repeat in 2 hours if headache persists or recurs. 9 tablet 11  . Fremanezumab-vfrm (AJOVY) 225 MG/1.5ML SOAJ Inject 225 mg into the skin every 30 (thirty) days. 4.5 mL 6  . Rimegepant Sulfate (NURTEC) 75 MG TBDP Take 75 mg by mouth daily as needed. For migraines. Take as close to onset of migraine as possible.  One daily maximum. 8 tablet 0  . topiramate (TOPAMAX) 25 MG tablet TAKE 1 TABLET BY MOUTH EVERY MORNING AND 2 TABLETS EVERY EVENING 270 tablet 4   No current facility-administered medications for this visit.    Allergies as of 01/27/2020 - Review Complete 01/27/2020  Allergen Reaction Noted  . Celecoxib Hives 10/01/2014  . Codeine Nausea And Vomiting 08/30/2012  . Morphine Other (See Comments) 10/01/2014  . Sumatriptan Other (See Comments) 10/01/2014  . Sulfa antibiotics Rash and Hives 08/29/2012    Family History  Problem Relation Age of Onset  . Hypertension Mother   . Cancer Mother   . Heart disease Father   . Stroke Maternal Grandmother   . Heart disease Paternal Grandmother   . Cancer Paternal Grandfather        Stomach  . Migraines Sister   . Migraines Maternal Aunt        great aunt  . Migraines Daughter   . Other Daughter        severe nerve damage, chronic pain  . Colon cancer Neg Hx     Social History   Socioeconomic History  . Marital status: Widowed    Spouse name: Not on file  . Number of children: 1  . Years of education: BS  . Highest education level: Not on file  Occupational History  .  Occupation: Retired    Comment: Runner, broadcasting/film/video, retired in 2014  Tobacco Use  . Smoking status: Former Smoker    Packs/day: 0.50    Types: Cigarettes  . Smokeless tobacco: Never Used  Vaping Use  . Vaping Use: Never used  Substance and Sexual Activity  . Alcohol use: Yes    Comment: occasionally wine  . Drug use: No  . Sexual activity: Never    Birth control/protection: Post-menopausal  Other Topics Concern  . Not on file  Social History Narrative   Patient lives at home alone.   Caffeine Use: 1 soda and a few cups of coffee daily   Right handed   Social Determinants of Health   Financial Resource Strain:   . Difficulty of Paying Living Expenses: Not on file  Food Insecurity:   . Worried About Programme researcher, broadcasting/film/video in the Last Year: Not on file  . Ran Out of  Food in the Last Year: Not on file  Transportation Needs:   . Lack of Transportation (Medical): Not on file  . Lack of Transportation (Non-Medical): Not on file  Physical Activity:   . Days of Exercise per Week: Not on file  . Minutes of Exercise per Session: Not on file  Stress:   . Feeling of Stress : Not on file  Social Connections:   . Frequency of Communication with Friends and Family: Not on file  . Frequency of Social Gatherings with Friends and Family: Not on file  . Attends Religious Services: Not on file  . Active Member of Clubs or Organizations: Not on file  . Attends Banker Meetings: Not on file  . Marital Status: Not on file    Review of Systems: Gen: Denies fever, chills, anorexia. Denies fatigue, weakness, weight loss.  CV: Denies chest pain, palpitations, syncope, peripheral edema, and claudication. Resp: Denies dyspnea at rest, cough, wheezing, coughing up blood, and pleurisy. GI: see HPI Derm: Denies rash, itching, dry skin Psych: Denies depression, anxiety, memory loss, confusion. No homicidal or suicidal ideation.  Heme: Denies bruising, bleeding, and enlarged lymph nodes.  Physical Exam: BP 116/79   Pulse 79   Temp 97.9 F (36.6 C)   Ht 5\' 1"  (1.549 m)   Wt 142 lb 9.6 oz (64.7 kg)   BMI 26.94 kg/m  General:   Alert and oriented. No distress noted. Pleasant and cooperative.  Head:  Normocephalic and atraumatic. Eyes:  Conjuctiva clear without scleral icterus. Mouth:  Mask in place Cardiac: S1 S2 present without murmurs Lungs: clear bilaterally Abdomen:  +BS, soft, mild TTP periumbilically and non-distended. No rebound or guarding. No HSM or masses noted. Msk:  Symmetrical without gross deformities. Normal posture. Extremities:  Without edema. Neurologic:  Alert and  oriented x4 Psych:  Alert and cooperative. Normal mood and affect.  ASSESSMENT: Paula Trevino is a 64 y.o. female presenting today with reported history of colon polyps in  2005/2006 but records not available in our system, overdue for surveillance. Chronic IBS symptoms diarrhea predominant mostly associated with dietary choices; no concerning upper or lower GI signs/symptoms. She prefers to avoid supportive measures for IBS and modifies diet.   PLAN:  Proceed with colonoscopy by Dr. 09/2004 in near future with Propofol: the risks, benefits, and alternatives have been discussed with the patient in detail. The patient states understanding and desires to proceed.  Further recommendations to follow.    Jena Gauss, PhD, ANP-BC [facility]

## 2020-01-27 NOTE — Telephone Encounter (Signed)
Patient returned call to schedule procedure, call back when you can

## 2020-01-27 NOTE — Patient Instructions (Signed)
We are arranging a colonoscopy in the near future!  Further recommendations to follow!  I enjoyed seeing you again today! As you know, I value our relationship and want to provide genuine, compassionate, and quality care. I welcome your feedback. If you receive a survey regarding your visit,  I greatly appreciate you taking time to fill this out. See you next time!  Rubert Frediani W. Lawayne Hartig, PhD, ANP-BC [facility]   

## 2020-01-28 ENCOUNTER — Telehealth: Payer: Self-pay | Admitting: Internal Medicine

## 2020-01-28 NOTE — Telephone Encounter (Signed)
PATIENT RETURNED CALL  

## 2020-01-28 NOTE — Telephone Encounter (Signed)
Called pt, VM full

## 2020-01-28 NOTE — Telephone Encounter (Signed)
Called pt back. Received vm, unable to leave a message.

## 2020-02-02 NOTE — Telephone Encounter (Signed)
LMOVM for pt. Letter mailed. 

## 2020-02-10 ENCOUNTER — Telehealth: Payer: Self-pay | Admitting: Internal Medicine

## 2020-02-10 MED ORDER — CLENPIQ 10-3.5-12 MG-GM -GM/160ML PO SOLN: 1.0000 | Freq: Once | ORAL | 0 refills | Status: AC

## 2020-02-10 NOTE — Telephone Encounter (Signed)
Patient returned call. She has been scheduled for 12/9 at 9:00am. Aware will mail prep instructions with covid test appt. Confirmed mailing address.patient requested lowest volume prep. RX sent to [facility]

## 2020-02-10 NOTE — Telephone Encounter (Signed)
Pt was returning call to schedule her colonoscopy. [phone]

## 2020-02-13 ENCOUNTER — Other Ambulatory Visit: Payer: Self-pay | Admitting: Neurology

## 2020-03-17 ENCOUNTER — Other Ambulatory Visit: Payer: Self-pay

## 2020-03-17 ENCOUNTER — Other Ambulatory Visit (HOSPITAL_COMMUNITY)
Admission: RE | Admit: 2020-03-17 | Discharge: 2020-03-17 | Disposition: A | Discharge status: Home / Self Care | Payer: BC Managed Care – PPO | Source: Ambulatory Visit | Attending: Internal Medicine | Admitting: Internal Medicine

## 2020-03-17 DIAGNOSIS — Z86010 Personal history of colonic polyps: Secondary | ICD-10-CM | POA: Diagnosis not present

## 2020-03-17 DIAGNOSIS — Z79899 Other long term (current) drug therapy: Secondary | ICD-10-CM | POA: Diagnosis not present

## 2020-03-17 DIAGNOSIS — Z1211 Encounter for screening for malignant neoplasm of colon: Secondary | ICD-10-CM | POA: Diagnosis not present

## 2020-03-17 DIAGNOSIS — Z87891 Personal history of nicotine dependence: Secondary | ICD-10-CM | POA: Diagnosis not present

## 2020-03-17 DIAGNOSIS — Z01812 Encounter for preprocedural laboratory examination: Secondary | ICD-10-CM | POA: Insufficient documentation

## 2020-03-17 DIAGNOSIS — Z20822 Contact with and (suspected) exposure to covid-19: Secondary | ICD-10-CM | POA: Insufficient documentation

## 2020-03-17 LAB — SARS CORONAVIRUS 2 (TAT 6-24 HRS): SARS Coronavirus 2: NEGATIVE

## 2020-03-18 ENCOUNTER — Ambulatory Visit (HOSPITAL_COMMUNITY)
Admission: RE | Admit: 2020-03-18 | Discharge: 2020-03-18 | Disposition: A | Discharge status: Home / Self Care | Payer: BC Managed Care – PPO | Source: Ambulatory Visit | Attending: Internal Medicine | Admitting: Internal Medicine

## 2020-03-18 ENCOUNTER — Encounter (HOSPITAL_COMMUNITY)
Admission: RE | Disposition: A | Discharge status: Home / Self Care | Payer: Self-pay | Source: Ambulatory Visit | Attending: Internal Medicine

## 2020-03-18 ENCOUNTER — Ambulatory Visit (HOSPITAL_COMMUNITY): Payer: BC Managed Care – PPO | Admitting: Anesthesiology

## 2020-03-18 ENCOUNTER — Other Ambulatory Visit: Payer: Self-pay

## 2020-03-18 ENCOUNTER — Encounter (HOSPITAL_COMMUNITY): Payer: Self-pay | Admitting: Internal Medicine

## 2020-03-18 DIAGNOSIS — Z1211 Encounter for screening for malignant neoplasm of colon: Secondary | ICD-10-CM | POA: Diagnosis not present

## 2020-03-18 DIAGNOSIS — Z87891 Personal history of nicotine dependence: Secondary | ICD-10-CM | POA: Insufficient documentation

## 2020-03-18 DIAGNOSIS — Z20822 Contact with and (suspected) exposure to covid-19: Secondary | ICD-10-CM | POA: Insufficient documentation

## 2020-03-18 DIAGNOSIS — Z86010 Personal history of colonic polyps: Secondary | ICD-10-CM | POA: Diagnosis not present

## 2020-03-18 DIAGNOSIS — Z79899 Other long term (current) drug therapy: Secondary | ICD-10-CM | POA: Insufficient documentation

## 2020-03-18 HISTORY — DX: Nausea with vomiting, unspecified: R11.2

## 2020-03-18 HISTORY — DX: Other specified postprocedural states: Z98.890

## 2020-03-18 HISTORY — PX: COLONOSCOPY WITH PROPOFOL: SHX5780

## 2020-03-18 SURGERY — COLONOSCOPY WITH PROPOFOL
Anesthesia: General

## 2020-03-18 MED ORDER — PROPOFOL 500 MG/50ML IV EMUL
INTRAVENOUS | Status: DC | PRN
  Administered 2020-03-18: 150 ug/kg/min via INTRAVENOUS

## 2020-03-18 MED ORDER — LACTATED RINGERS IV SOLN: Freq: Once | INTRAVENOUS | Status: AC

## 2020-03-18 MED ORDER — STERILE WATER FOR IRRIGATION IR SOLN
Status: DC | PRN
  Administered 2020-03-18: 100 mL

## 2020-03-18 MED ORDER — LIDOCAINE HCL (CARDIAC) PF 100 MG/5ML IV SOSY
PREFILLED_SYRINGE | INTRAVENOUS | Status: DC | PRN
  Administered 2020-03-18: 50 mg via INTRAVENOUS

## 2020-03-18 MED ORDER — PROPOFOL 10 MG/ML IV BOLUS
INTRAVENOUS | Status: DC | PRN
  Administered 2020-03-18: 100 mg via INTRAVENOUS

## 2020-03-18 MED ORDER — LACTATED RINGERS IV SOLN: INTRAVENOUS | Status: DC | PRN

## 2020-03-18 NOTE — Transfer of Care (Signed)
Immediate Anesthesia Transfer of Care Note  Patient: Paula Trevino  Procedure(s) Performed: COLONOSCOPY WITH PROPOFOL (N/A )  Patient Location: PACU  Anesthesia Type:General  Level of Consciousness: awake, alert  and oriented  Airway & Oxygen Therapy: Patient Spontanous Breathing  Post-op Assessment: Report given to RN and Post -op Vital signs reviewed and stable  Post vital signs: Reviewed and stable  Last Vitals:  Vitals Value Taken Time  BP    Temp    Pulse    Resp    SpO2      Last Pain:  Vitals:   03/18/20 0848  TempSrc:   PainSc: 0-No pain         Complications: No complications documented.

## 2020-03-18 NOTE — Anesthesia Postprocedure Evaluation (Signed)
Anesthesia Post Note  Patient: Paula Trevino  Procedure(s) Performed: COLONOSCOPY WITH PROPOFOL (N/A )  Patient location during evaluation: Phase II Anesthesia Type: General Level of consciousness: awake, oriented and awake and alert Pain management: pain level controlled Vital Signs Assessment: post-procedure vital signs reviewed and stable Respiratory status: spontaneous breathing and nonlabored ventilation Cardiovascular status: blood pressure returned to baseline and stable Postop Assessment: no apparent nausea or vomiting Anesthetic complications: no   No complications documented.   Last Vitals:  Vitals:   03/18/20 0753 03/18/20 0755  BP:  127/69  Pulse: 81   Resp: 19   Temp: 36.8 C   SpO2: 97%     Last Pain:  Vitals:   03/18/20 0848  TempSrc:   PainSc: 0-No pain                 Lorin Glass

## 2020-03-18 NOTE — Op Note (Signed)
[facility] Patient Name: Paula Trevino Procedure Date: 03/18/2020 8:47 AM MRN: [id number] Date of Birth: 1955/06/27 Attending MD: Gennette Pac , MD CSN: [id number] Age: 64 Admit Type: Outpatient Procedure:                Colonoscopy Indications:              High risk colon cancer surveillance: Personal                            history of colonic polyps Providers:                Gennette Pac, MD, Angelica Ran, Edrick Kins,                            RN, Dyann Ruddle Referring MD:              Medicines:                Propofol per Anesthesia Complications:            No immediate complications. Estimated Blood Loss:     Estimated blood loss: none. Procedure:                Pre-Anesthesia Assessment:                           - Prior to the procedure, a History and Physical                            was performed, and patient medications and                            allergies were reviewed. The patient's tolerance of                            previous anesthesia was also reviewed. The risks                            and benefits of the procedure and the sedation                            options and risks were discussed with the patient.                            All questions were answered, and informed consent                            was obtained. Prior Anticoagulants: The patient has                            taken no previous anticoagulant or antiplatelet                            agents. ASA Grade Assessment: II - A patient with  mild systemic disease. After reviewing the risks                            and benefits, the patient was deemed in                            satisfactory condition to undergo the procedure.                           After obtaining informed consent, the colonoscope                            was passed under direct vision. Throughout the                            procedure, the patient's blood  pressure, pulse, and                            oxygen saturations were monitored continuously. The                            CF-[id number] ([id number]) scope was introduced through                            the anus and advanced to the 10 cm into the ileum.                            The colonoscopy was performed without difficulty.                            The patient tolerated the procedure well. The                            quality of the bowel preparation was adequate. Scope In: 8:54:18 AM Scope Out: 9:03:54 AM Scope Withdrawal Time: 0 hours 6 minutes 27 seconds  Total Procedure Duration: 0 hours 9 minutes 36 seconds  Findings:      The perianal and digital rectal examinations were normal.      The colon (entire examined portion) appeared normal. Distal 10 cm of TI       appeared normal.      The retroflexed view of the distal rectum and anal verge was normal and       showed no anal or rectal abnormalities. Impression:               - The entire examined colon is normal.                           - The distal rectum and anal verge are normal on                            retroflexion view.                           - No specimens collected. Moderate Sedation:  Moderate (conscious) sedation was personally administered by an       anesthesia professional. The following parameters were monitored: oxygen       saturation, heart rate, blood pressure, and response to care. Recommendation:           - Patient has a contact number available for                            emergencies. The signs and symptoms of potential                            delayed complications were discussed with the                            patient. Return to normal activities tomorrow.                            Written discharge instructions were provided to the                            patient.                           - Resume previous diet.                           - Continue present medications.                            - Repeat colonoscopy in 7 years for surveillance.                           - Return to GI office in 4 months. Procedure Code(s):        --- Professional ---                           [phone], Colonoscopy, flexible; diagnostic, including                            collection of specimen(s) by brushing or washing,                            when performed (separate procedure) Diagnosis Code(s):        --- Professional ---                           Z86.010, Personal history of colonic polyps CPT copyright 2019 American Medical Association. All rights reserved. The codes documented in this report are preliminary and upon coder review may  be revised to meet current compliance requirements. Gerrit Friends. Odelia Graciano, MD Gennette Pac, MD 03/18/2020 9:11:32 AM This report has been signed electronically. Number of Addenda: 0

## 2020-03-18 NOTE — Discharge Instructions (Signed)
  Colonoscopy Discharge Instructions  Read the instructions outlined below and refer to this sheet in the next few weeks. These discharge instructions provide you with general information on caring for yourself after you leave the hospital. Your doctor may also give you specific instructions. While your treatment has been planned according to the most current medical practices available, unavoidable complications occasionally occur. If you have any problems or questions after discharge, call Dr. Jena Gauss at [phone]. ACTIVITY  You may resume your regular activity, but move at a slower pace for the next 24 hours.   Take frequent rest periods for the next 24 hours.   Walking will help get rid of the air and reduce the bloated feeling in your belly (abdomen).   No driving for 24 hours (because of the medicine (anesthesia) used during the test).    Do not sign any important legal documents or operate any machinery for 24 hours (because of the anesthesia used during the test).  NUTRITION  Drink plenty of fluids.   You may resume your normal diet as instructed by your doctor.   Begin with a light meal and progress to your normal diet. Heavy or fried foods are harder to digest and may make you feel sick to your stomach (nauseated).   Avoid alcoholic beverages for 24 hours or as instructed.  MEDICATIONS  You may resume your normal medications unless your doctor tells you otherwise.  WHAT YOU CAN EXPECT TODAY  Some feelings of bloating in the abdomen.   Passage of more gas than usual.   Spotting of blood in your stool or on the toilet paper.  FINDING OUT THE RESULTS OF YOUR TEST Not all test results are available during your visit. If your test results are not back during the visit, make an appointment with your caregiver to find out the results. Do not assume everything is normal if you have not heard from your caregiver or the medical facility. It is important for you to follow up on all of  your test results.  SEEK IMMEDIATE MEDICAL ATTENTION IF:  You have more than a spotting of blood in your stool.   Your belly is swollen (abdominal distention).   You are nauseated or vomiting.   You have a temperature over 101.   You have abdominal pain or discomfort that is severe or gets worse throughout the day.   Your colon appeared normal today  I recommend a repeat colonoscopy in 7 years  At patient request, I called French Ana at [phone] -reviewed results  Office visit with Lewie Loron in 4 months

## 2020-03-18 NOTE — Anesthesia Preprocedure Evaluation (Signed)
Anesthesia Evaluation  Patient identified by MRN, date of birth, ID band Patient awake    Reviewed: Allergy & Precautions, NPO status , Patient's Chart, lab work & pertinent test results  History of Anesthesia Complications (+) PONV and history of anesthetic complications  Airway Mallampati: II  TM Distance: >3 FB Neck ROM: Full    Dental  (+) Dental Advisory Given, Implants Crowns x4:   Pulmonary former smoker,    Pulmonary exam normal breath sounds clear to auscultation       Cardiovascular Exercise Tolerance: Good Normal cardiovascular exam Rhythm:Regular Rate:Normal     Neuro/Psych  Headaches, PSYCHIATRIC DISORDERS Anxiety Depression    GI/Hepatic   Endo/Other    Renal/GU Renal disease     Musculoskeletal  (+) Arthritis ,   Abdominal   Peds  Hematology   Anesthesia Other Findings   Reproductive/Obstetrics                             Anesthesia Physical Anesthesia Plan  ASA: II  Anesthesia Plan: General   Post-op Pain Management:    Induction: Intravenous  PONV Risk Score and Plan: TIVA  Airway Management Planned: Nasal Cannula and Natural Airway  Additional Equipment:   Intra-op Plan:   Post-operative Plan:   Informed Consent: I have reviewed the patients History and Physical, chart, labs and discussed the procedure including the risks, benefits and alternatives for the proposed anesthesia with the patient or authorized representative who has indicated his/her understanding and acceptance.     Dental advisory given  Plan Discussed with: CRNA and Surgeon  Anesthesia Plan Comments:         Anesthesia Quick Evaluation

## 2020-03-18 NOTE — Anesthesia Postprocedure Evaluation (Signed)
Anesthesia Post Note  Patient: Paula Trevino  Procedure(s) Performed: COLONOSCOPY WITH PROPOFOL (N/A )  Patient location during evaluation: Endoscopy Anesthesia Type: General Level of consciousness: awake and alert and oriented Pain management: pain level controlled Vital Signs Assessment: post-procedure vital signs reviewed and stable Respiratory status: spontaneous breathing, nonlabored ventilation and respiratory function stable Cardiovascular status: blood pressure returned to baseline and stable Postop Assessment: no apparent nausea or vomiting and adequate PO intake Anesthetic complications: no   No complications documented.   Last Vitals:  Vitals:   03/18/20 0755 03/18/20 0907  BP: 127/69 (!) 99/50  Pulse:  73  Resp:  20  Temp:  36.7 C  SpO2:  99%    Last Pain:  Vitals:   03/18/20 0848  TempSrc:   PainSc: 0-No pain                 Julian Reil

## 2020-03-18 NOTE — H&P (Signed)
@LOGO @   Primary Care Physician:  , MD Primary Gastroenterologist:  Dr. Benita Stabile  Pre-Procedure History & Physical: HPI:  Paula Trevino is a 64 y.o. female here for surveillance colonoscopy.  History of colonic polyps removed previously.  Old records not currently available.  Occasional postprandial diarrhea consistent with irritable bowel syndrome symptoms.  Has normal bowel function most of the time.  No bleeding.  Past Medical History:  Diagnosis Date  . Arthritis   . Colon polyps   . Difficulty sleeping   . E coli enteritis   . IBS (irritable bowel syndrome)   . Migraines   . Norovirus 06/2017  . PONV (postoperative nausea and vomiting)    itching    Past Surgical History:  Procedure Laterality Date  . CATARACT EXTRACTION  12/2019  . CESAREAN SECTION  1991  . CHOLECYSTECTOMY  1997  . PARS PLANA VITRECTOMY W/ REPAIR OF MACULAR HOLE    . SHOULDER SURGERY      Prior to Admission medications   Medication Sig Start Date End Date Taking? Authorizing Provider  acetaminophen (TYLENOL) 500 MG tablet Take 500-1,000 mg by mouth every 6 (six) hours as needed for headache.   Yes [provider]  ALPRAZolam (XANAX) 0.25 MG tablet Take 1 tablet (0.25 mg total) by mouth at bedtime as needed for sleep. 09/24/19  Yes 09/26/19, MD  carboxymethylcellulose (REFRESH PLUS) 0.5 % SOLN Place 1 drop into both eyes in the morning, at noon, in the evening, and at bedtime.   Yes [provider]  cetirizine (ZYRTEC) 10 MG tablet Take 10 mg by mouth daily.   Yes [provider]  cyclobenzaprine (FLEXERIL) 10 MG tablet Take 1 tablet (10 mg total) by mouth at bedtime. Patient taking differently: Take 10 mg by mouth at bedtime as needed.  11/26/19  Yes 11/28/19, MD  Fremanezumab-vfrm (AJOVY) 225 MG/1.5ML SOAJ Inject 225 mg into the skin every 30 (thirty) days. 11/26/19  Yes 11/28/19, MD  Rimegepant Sulfate (NURTEC) 75 MG TBDP Take 75 mg by mouth daily  as needed. For migraines. Take as close to onset of migraine as possible. One daily maximum. Patient taking differently: Take 75 mg by mouth daily as needed (For migraines). Take as close to onset of migraine as possible. One daily maximum. 11/26/19  Yes 11/28/19, MD  topiramate (TOPAMAX) 25 MG tablet TAKE 1 TABLET BY MOUTH EVERY MORNING AND 2 TABLETS EVERY EVENING Patient taking differently: Take 25 mg by mouth See admin instructions. TAKE 25 mg in the morning and 50 mg at bedtime 11/26/19  Yes 11/28/19, MD  eletriptan (RELPAX) 40 MG tablet May repeat in 2 hours if headache persists or recurs. Patient taking differently: Take 40 mg by mouth every 2 (two) hours as needed for migraine. May repeat in 2 hours if headache persists or recurs. 11/26/19   11/28/19, MD    Allergies as of 02/10/2020 - Review Complete 01/27/2020  Allergen Reaction Noted  . Celecoxib Hives 10/01/2014  . Codeine Nausea And Vomiting 08/30/2012  . Morphine Other (See Comments) 10/01/2014  . Sumatriptan Other (See Comments) 10/01/2014  . Sulfa antibiotics Rash and Hives 08/29/2012    Family History  Problem Relation Age of Onset  . Hypertension Mother   . Cancer Mother   . Heart disease Father   . Stroke Maternal Grandmother   . Heart disease Paternal Grandmother   . Cancer Paternal Grandfather  Stomach  . Migraines Sister   . Migraines Maternal Aunt        great aunt  . Migraines Daughter   . Other Daughter        severe nerve damage, chronic pain  . Colon cancer Neg Hx     Social History   Socioeconomic History  . Marital status: Widowed    Spouse name: Not on file  . Number of children: 1  . Years of education: BS  . Highest education level: Not on file  Occupational History  . Occupation: Retired    Comment: Runner, broadcasting/film/video, retired in 2014  Tobacco Use  . Smoking status: Former Smoker    Packs/day: 0.50    Types: Cigarettes  . Smokeless tobacco: Never Used  Vaping Use  .  Vaping Use: Never used  Substance and Sexual Activity  . Alcohol use: Yes    Comment: occasionally wine  . Drug use: No  . Sexual activity: Never    Birth control/protection: Post-menopausal  Other Topics Concern  . Not on file  Social History Narrative   Patient lives at home alone.   Caffeine Use: 1 soda and a few cups of coffee daily   Right handed   Social Determinants of Health   Financial Resource Strain: Not on file  Food Insecurity: Not on file  Transportation Needs: Not on file  Physical Activity: Not on file  Stress: Not on file  Social Connections: Not on file  Intimate Partner Violence: Not on file    Review of Systems: See HPI, otherwise negative ROS  Physical Exam: BP 127/69   Pulse 81   Temp 98.2 F (36.8 C) (Oral)   Resp 19   Ht 5\' 1"  (1.549 m)   SpO2 97%   BMI 26.94 kg/m  General:   Alert,  Well-developed, well-nourished, pleasant and cooperative in NAD SNeck:  Supple; no masses or thyromegaly. No significant cervical adenopathy. Lungs:  Clear throughout to auscultation.   No wheezes, crackles, or rhonchi. No acute distress. Heart:  Regular rate and rhythm; no murmurs, clicks, rubs,  or gallops. Abdomen: Non-distended, normal bowel sounds.  Soft and nontender without appreciable mass or hepatosplenomegaly.  Pulses:  Normal pulses noted. Extremities:  Without clubbing or edema.  Impression/Plan: 64 year old lady here for surveillance colonoscopy given history of polyps. The risks, benefits, limitations, alternatives and imponderables have been reviewed with the patient. Questions have been answered. All parties are agreeable.      Notice: This dictation was prepared with Dragon dictation along with smaller phrase technology. Any transcriptional errors that result from this process are unintentional and may not be corrected upon review.

## 2020-03-24 ENCOUNTER — Encounter (HOSPITAL_COMMUNITY): Payer: Self-pay | Admitting: Internal Medicine

## 2020-03-31 ENCOUNTER — Other Ambulatory Visit: Payer: Self-pay | Admitting: Neurology

## 2020-03-31 NOTE — Telephone Encounter (Signed)
Pending appt august 2022.

## 2020-03-31 NOTE — Telephone Encounter (Signed)
Per [place] registry, last filled on 09/24/2019 #15 prescribed by Dr Lucia Gaskins.

## 2020-04-01 ENCOUNTER — Telehealth: Payer: Self-pay | Admitting: Neurology

## 2020-04-01 NOTE — Telephone Encounter (Signed)
Let patient know I will not be able to prescribe this going forward. I can give her one more limited refill so she can talk to her primary care or other physician but I suggest trying to wean off of it due to risk of dementia and parkinsons with long-term use. Thanks.

## 2020-04-01 NOTE — Telephone Encounter (Signed)
Spoke with patient and advised her of Dr Trevor Mace message regarding the Xanax. She verbalized understanding and her questions were answered.

## 2020-07-20 ENCOUNTER — Ambulatory Visit: Payer: BC Managed Care – PPO | Admitting: Gastroenterology

## 2020-12-01 ENCOUNTER — Other Ambulatory Visit: Payer: Self-pay | Admitting: Neurology

## 2020-12-02 ENCOUNTER — Other Ambulatory Visit: Payer: Self-pay | Admitting: Neurology

## 2020-12-06 ENCOUNTER — Encounter: Payer: Self-pay | Admitting: Neurology

## 2020-12-06 ENCOUNTER — Ambulatory Visit (INDEPENDENT_AMBULATORY_CARE_PROVIDER_SITE_OTHER): Payer: Medicare Other | Admitting: Neurology

## 2020-12-06 ENCOUNTER — Other Ambulatory Visit: Payer: Self-pay | Admitting: Neurology

## 2020-12-06 VITALS — BP 138/74 | HR 76 | Ht 61.0 in | Wt 143.4 lb

## 2020-12-06 DIAGNOSIS — H04129 Dry eye syndrome of unspecified lacrimal gland: Secondary | ICD-10-CM

## 2020-12-06 DIAGNOSIS — G43711 Chronic migraine without aura, intractable, with status migrainosus: Secondary | ICD-10-CM

## 2020-12-06 DIAGNOSIS — F439 Reaction to severe stress, unspecified: Secondary | ICD-10-CM

## 2020-12-06 DIAGNOSIS — G44209 Tension-type headache, unspecified, not intractable: Secondary | ICD-10-CM

## 2020-12-06 DIAGNOSIS — F458 Other somatoform disorders: Secondary | ICD-10-CM

## 2020-12-06 DIAGNOSIS — H04123 Dry eye syndrome of bilateral lacrimal glands: Secondary | ICD-10-CM | POA: Diagnosis not present

## 2020-12-06 MED ORDER — TOPIRAMATE 100 MG PO TABS: 100.0000 mg | ORAL_TABLET | Freq: Every evening | ORAL | 4 refills | Status: DC

## 2020-12-06 MED ORDER — TOPIRAMATE 100 MG PO TABS: 100.0000 mg | ORAL_TABLET | Freq: Two times a day (BID) | ORAL | 3 refills | Status: DC

## 2020-12-06 MED ORDER — NURTEC 75 MG PO TBDP: 75.0000 mg | ORAL_TABLET | ORAL | 11 refills | Status: DC

## 2020-12-06 MED ORDER — AJOVY 225 MG/1.5ML ~~LOC~~ SOAJ: SUBCUTANEOUS | 11 refills | Status: DC

## 2020-12-06 MED ORDER — ELETRIPTAN HYDROBROMIDE 40 MG PO TABS: ORAL_TABLET | ORAL | 11 refills | Status: DC

## 2020-12-06 NOTE — Patient Instructions (Signed)
Continue Ajovy Start Nurtec every other day Increase Topiramate to 100mg  at bedtime Consider trying Propranolol next  Rimegepant oral dissolving tablet What is this medication? RIMEGEPANT (ri ME je pant) is used to treat migraine headaches with or without aura. An aura is a strange feeling or visual disturbance that warns you of an attack. It is also used to prevent migraine headaches. This medicine may be used for other purposes; ask your health care provider or pharmacist if you have questions. COMMON BRAND NAME(S): NURTEC ODT What should I tell my care team before I take this medication? They need to know if you have any of these conditions: kidney disease liver disease an unusual or allergic reaction to rimegepant, other medicines, foods, dyes, or preservatives pregnant or trying to get pregnant breast-feeding How should I use this medication? Take the medicine by mouth. Follow the directions on the prescription label. Leave the tablet in the sealed blister pack until you are ready to take it. With dry hands, open the blister and gently remove the tablet. If the tablet breaks or crumbles, throw it away and take a new tablet out of the blister pack. Place the tablet in the mouth and allow it to dissolve, and then swallow. Do not cut, crush, or chew this medicine. You do not need water to take this medicine. Talk to your pediatrician about the use of this medicine in children. Special care may be needed. Overdosage: If you think you have taken too much of this medicine contact a poison control center or emergency room at once. NOTE: This medicine is only for you. Do not share this medicine with others. What if I miss a dose? This does not apply. This medicine is not for regular use. What may interact with this medication? This medicine may interact with the following medications: certain medicines for fungal infections like fluconazole, itraconazole rifampin This list may not describe  all possible interactions. Give your health care provider a list of all the medicines, herbs, non-prescription drugs, or dietary supplements you use. Also tell them if you smoke, drink alcohol, or use illegal drugs. Some items may interact with your medicine. What should I watch for while using this medication? Visit your health care professional for regular checks on your progress. Tell your health care professional if your symptoms do not start to get better or if they get worse. What side effects may I notice from receiving this medication? Side effects that you should report to your doctor or health care professional as soon as possible: allergic reactions like skin rash, itching or hives; swelling of the face, lips, or tongue Side effects that usually do not require medical attention (report these to your doctor or health care professional if they continue or are bothersome): nausea This list may not describe all possible side effects. Call your doctor for medical advice about side effects. You may report side effects to FDA at 1-800-FDA-1088. Where should I keep my medication? Keep out of the reach of children and pets. Store at room temperature between 20 and 25 degrees C (68 and 77 degrees F). Get rid of any unused medicine after the expiration date. To get rid of medicines that are no longer needed or have expired: Take the medicine to a medicine take-back program. Check with your pharmacy or law enforcement to find a location. If you cannot return the medicine, check the label or package insert to see if the medicine should be thrown out in the garbage or flushed  down the toilet. If you are not sure, ask your health care provider. If it is safe to put it in the trash, take the medicine out of the container. Mix the medicine with cat litter, dirt, coffee grounds, or other unwanted substance. Seal the mixture in a bag or container. Put it in the trash. NOTE: This sheet is a summary. It may not  cover all possible information. If you have questions about this medicine, talk to your doctor, pharmacist, or health care provider.  2022 Elsevier/Gold Standard (2019-09-09 17:56:55)  Propranolol Extended-Release Capsules What is this medication? PROPRANOLOL (proe PRAN oh lole) is a beta blocker. It decreases the amount of work your heart has to do and helps your heart beat regularly. It treats highblood pressure and/or prevent chest pain (also called angina). This medicine may be used for other purposes; ask your health care provider orpharmacist if you have questions. COMMON BRAND NAME(S): Inderal LA, Inderal XL, InnoPran XL What should I tell my care team before I take this medication? They need to know if you have any of these conditions: circulation problems, or blood vessel disease diabetes history of heart attack or heart disease, vasospastic angina kidney disease liver disease lung or breathing disease, like asthma or emphysema pheochromocytoma slow heart rate thyroid disease an unusual or allergic reaction to propranolol, other beta-blockers, medicines, foods, dyes, or preservatives pregnant or trying to get pregnant breast-feeding How should I use this medication? Take this drug by mouth. Take it as directed on the prescription label at the same time every day. Do not cut, crush or chew this drug. Swallow the capsules whole. You can take it with or without food. If it upsets your stomach, take itwith food. Keep taking it unless your health care provider tells you to stop. Talk to your health care provider about the use of this drug in children.Special care may be needed. Overdosage: If you think you have taken too much of this medicine contact apoison control center or emergency room at once. NOTE: This medicine is only for you. Do not share this medicine with others. What if I miss a dose? If you miss a dose, take it as soon as you can. If it is almost time for yournext dose,  take only that dose. Do not take double or extra doses. What may interact with this medication? Do not take this medicine with any of the following medications: feverfew phenothiazines like chlorpromazine, mesoridazine, prochlorperazine, thioridazine This medicine may also interact with the following medications: aluminum hydroxide gel antipyrine antiviral medicines for HIV or AIDS barbiturates like phenobarbital certain medicines for blood pressure, heart disease, irregular heart beat cimetidine ciprofloxacin diazepam fluconazole haloperidol isoniazid medicines for cholesterol like cholestyramine or colestipol medicines for mental depression medicines for migraine headache like almotriptan, eletriptan, frovatriptan, naratriptan, rizatriptan, sumatriptan, zolmitriptan NSAIDs, medicines for pain and inflammation, like ibuprofen or naproxen phenytoin rifampin teniposide theophylline thyroid medicines tolbutamide warfarin zileuton This list may not describe all possible interactions. Give your health care provider a list of all the medicines, herbs, non-prescription drugs, or dietary supplements you use. Also tell them if you smoke, drink alcohol, or use illegaldrugs. Some items may interact with your medicine. What should I watch for while using this medication? Visit your doctor or health care professional for regular check ups. Contact your doctor right away if your symptoms worsen. Check your blood pressure and pulse rate regularly. Ask your health care professional what your bloodpressure and pulse rate should be, and when you  should contact them. Do not stop taking this medicine suddenly. This could lead to seriousheart-related effects. You may get drowsy or dizzy. Do not drive, use machinery, or do anything that needs mental alertness until you know how this drug affects you. Do not stand or sit up quickly, especially if you are an older patient. This reduces the risk of dizzy or  fainting spells. Alcohol can make you more drowsy and dizzy.Avoid alcoholic drinks. This medicine may increase blood sugar. Ask your healthcare provider if changesin diet or medicines are needed if you have diabetes. Do not treat yourself for coughs, colds, or pain while you are taking this medicine without asking your doctor or health care professional for advice.Some ingredients may increase your blood pressure. What side effects may I notice from receiving this medication? Side effects that you should report to your doctor or health care professionalas soon as possible: allergic reactions like skin rash, itching or hives, swelling of the face, lips, or tongue breathing problems cold hands or feet difficulty sleeping, nightmares dry peeling skin hallucinations muscle cramps or weakness signs and symptoms of high blood sugar such as being more thirsty or hungry or having to urinate more than normal. You may also feel very tired or have blurry vision. slow heart rate swelling of the legs and ankles vomiting Side effects that usually do not require medical attention (report to yourdoctor or health care professional if they continue or are bothersome): change in sex drive or performance diarrhea dry sore eyes hair loss nausea weak or tired This list may not describe all possible side effects. Call your doctor for medical advice about side effects. You may report side effects to FDA at1-800-FDA-1088. Where should I keep my medication? Keep out of the reach of children and pets. Store at room temperature between 15 and 30 degrees C (59 and 86 degrees F). Protect from light and moisture. Keep the container tightly closed. Avoid exposure to extreme heat. Do not freeze. Throw away any unused drug after theexpiration date. NOTE: This sheet is a summary. It may not cover all possible information. If you have questions about this medicine, talk to your doctor, pharmacist, orhealth care provider.   2022 Elsevier/Gold Standard (2018-11-04 16:23:26)

## 2020-12-06 NOTE — Progress Notes (Signed)
[facility]   Provider:  Dr Lucia Gaskins Referring Provider: Assunta Found, MD Primary Care Physician:  [facility]   CC:  Migraine  Interval history 12/06/2020: This past December she started having dry eyes, that worsened her migraines. Not on the right where they always used to be. Now more on the left and due to severe pain in the eye and the left eye is so dry the left eye is causing her migraines. The drops help. Ajovy not helping. She is still not willing to do a sleep study. She stays tired all the time. This is all because of dry eyes.   Discussed propranolol at bedtime for migraine prevention and increase Topamax. We talked about nurtec every other day.   Interval history 11/26/2019: Baseline:  lingering daily headaches, at last appointment was having daily migraines. She is not waking up with the headaches every morning but still lingering 15 headache days a month, 3-4 migraines a month, zyrtec has helped, she has had some improvement with Ajovy, she had an excruciating headache after the covid shot, We discussed triptans and the new medications such as nurtec. In the past recommended sleep evaluation was recommended and she has declined. She has a lot of stress in her life and feel this affects her. She is on Topamax. She declines botox. The headaches are mild 1/2 the month. Asked her to keep a migraine diary. Her migraines start on the right, spreads to the back, pulsating/pounding, light and sound sensitivity, nausea. The other headaches are more pressure. Will give samples today of nurtec. She wants to stay on the ajovy. She is still smoking, she has a lot of stress. She has cut back.  Interval history 11/25/2018: patient here for follow up of headaches and migraines last seen approx. One year ago.She has been on topamax with good results.  Recommended sleep eval for morning headaches and she declined. For her smoking I started her on Wellbutrin and  recommended smoking counseling in the past.  Since July her migraines are worse. She wakes with headaches and through the day she is getting daily headaches and migraines. She has daily dull headaches. Discussed management, will continue Topamax and Eletriptan. Significant history of most women on her mother's side and daughter.   Interval history 11/20/2017: Patient is here, follow up on migraines. Recommended sleep eval but she never complied. She is on Topamax, relpax, flexeril, still smoking. There is stress with her daughter who has pain after c-section. They had to go to [place] to find someone to help. The Topamax is helping, she has a "tense head" moreso to stress. Discussed, will start Prozac in addition to the wellbutrin.    Interval history 10/03/2016: Patient is here for yearly follow-up of migraines. She has done well on Topamax, Relpax, Flexeril. Recommend sleep eval but she never complied. Had a long discussion about smoking cessation, started her on Wellbutrin and recommended hypnotist.  Not much has changed. She went for 8 months without smoking, she had a relapse in April but quit in the last week and back on the wellbutrin. Worse in the spring. She has hadaches often during the month little mild headache and severe migraines are once every 2 months but monthly 4 migraines but needs . Relpax helps when she takes it for migraines. Takes tylenol occasionally, she really don;t keep track. Asked her to keep a migraine diary. 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  Interval history 10/04/2015:  Here for follow up of migraines. She never had her sleep eval, discussed untreated sleep apnea can cause increased risk of stroke and other sequelae. Her migraines are feeling better. She is terrified that she has cancer, she has been smoking so long, she wants to quit. Discussed at length.  Will try her on Wellbutrin. Recommended hypnotist who performs this for smoking. Also recommended therapy for smoking cessation. Discussed other medications and side effects, chantix for example. Decided on Wellbutrin. Discussed side effects as detailed in the patient instructions. Asked her to use my chart to keep me informed of her progress. She clenches her jaw at night, may be stress, has seen a dentist and can't afford the mouthguard.     Interval History 10/01/2014:  Paula Trevino is a 65 y.o. female here as a follow up for migraine. She is transitioning to my care.She is waking up with headachesand thinks she is clenching her teeth or snoring. She is going to the dentist to see if teeth clenching is causing her headaches. She feels lightheaded and some tingling every once in a while but otherwise no side effects from the Topiramate. She takes one Topiramate the morning and 2 at night. Relpax helps with acute management. Snores at night. Excessive daytime sleepiness. Morning headache. ESS 14.   Interval History 03/27/2014:  When she is not drinking enough water she gets burning urination.  She is not sleeping well. She used to take Xanax. She is not getting any migraines. Has some tingling in the fingers occasionally but otherwise doing well.  She falls asleep but she wakes up at midnight and can't go to sleep for hours. Doesn't know why she is waking up, possibly to urinate then can't go back to sleep. She used to take Xanax every night but got off of it. She is not a good sleeper.     09/29/13 (LL):  Since last visit 6 months ago, headaches have picked up again in frequency as weather has gotten hotter.  Side effects of TPX diminished.  She is having more frequent headaches that have a more tight, pressure quality, but not many severe headaches.  Overall much better than before going on TPX.  She has not had great response from Sumatriptan; always needing second dose and not fully relieving headache.   Her daughter has had good response from Relpax.   03/10/13 (LL): Patient returns for 2 month follow up for Migraines, they have greatly improved on TPX. She is having some tingling in fingers and occasional facial numbness, balance seems off, nausea first few weeks (resolved). She is happy not to have severe headache though. Still having 1-2 milder headaches, treating with ibuprofen. She has not had to use Sumatriptan. She is happy with current treatment.    01/07/13 (VP): 65 year old right-handed female here for a evaluation of headaches, recurrent since 2012, but worsened since July 2014. Patient was in her 70s, she had developed right-sided headaches with photophobia, pulsating and throbbing sensation. She took over-the-counter medications with good relief. However, she was not officially diagnosed with migraine. By late 30s and 40s, she had stopped having headaches. 2012 headaches returned. Since summer 2014, headaches have been significantly worsen. She had recurrent right-sided throbbing severe headaches with retro-orbital pain, nausea, photophobia. She was sensitive to smells. She's been taking Excedrin Migraine, Benadryl, Motrin, Tylenol as a for headaches. Patient went to the emergency room one time because  of severe headaches, received headache cocktails and CT scan head which are unremarkable. Since then she's had lingering daily headaches. She has 2-3 severe headaches per week. Strong family history of migraine headaches in patient's sister, daughter, maternal great aunt. Patient is never tried anti-migraine medications in the past. No specific triggering factors   Review of Systems: Patient complains of symptoms per HPI as well as the following symptoms: stress . Pertinent negatives and positives per HPI. All others negative     Social History   Socioeconomic History   Marital status: Widowed    Spouse name: Not on file   Number of children: 1   Years of education: BS   Highest education  level: Not on file  Occupational History   Occupation: Retired    Comment: Runner, broadcasting/film/video, retired in 2014  Tobacco Use   Smoking status: Former    Packs/day: 0.50    Types: Cigarettes   Smokeless tobacco: Never  Vaping Use   Vaping Use: Never used  Substance and Sexual Activity   Alcohol use: Yes    Comment: occasionally wine   Drug use: No   Sexual activity: Never    Birth control/protection: Post-menopausal  Other Topics Concern   Not on file  Social History Narrative   Patient lives at home alone.   Caffeine Use: 1 soda and a few cups of coffee daily   Right handed   Social Determinants of Health   Financial Resource Strain: Not on file  Food Insecurity: Not on file  Transportation Needs: Not on file  Physical Activity: Not on file  Stress: Not on file  Social Connections: Not on file  Intimate Partner Violence: Not on file    Family History  Problem Relation Age of Onset   Hypertension Mother    Cancer Mother    Heart disease Father    Stroke Maternal Grandmother    Heart disease Paternal Grandmother    Cancer Paternal Grandfather        Stomach   Migraines Sister    Migraines Maternal Aunt        great aunt   Migraines Daughter    Other Daughter        severe nerve damage, chronic pain   Colon cancer Neg Hx     Past Medical History:  Diagnosis Date   Arthritis    Colon polyps    Difficulty sleeping    E coli enteritis    IBS (irritable bowel syndrome)    Migraines    Norovirus 06/2017   PONV (postoperative nausea and vomiting)    itching    Past Surgical History:  Procedure Laterality Date   CATARACT EXTRACTION  12/2019   CESAREAN SECTION  1991   CHOLECYSTECTOMY  1997   COLONOSCOPY WITH PROPOFOL N/A 03/18/2020   Procedure: COLONOSCOPY WITH PROPOFOL;  Surgeon: Corbin Ade, MD;  Location: [facility];  Service: Endoscopy;  Laterality: N/A;  9:00am   PARS PLANA VITRECTOMY W/ REPAIR OF MACULAR HOLE     SHOULDER SURGERY      Current  Outpatient Medications  Medication Sig Dispense Refill   acetaminophen (TYLENOL) 500 MG tablet Take 500-1,000 mg by mouth every 6 (six) hours as needed for headache.     ALPRAZolam (XANAX) 0.25 MG tablet TAKE 1 TABLET(0.25 MG) BY MOUTH AT BEDTIME AS NEEDED FOR SLEEP 15 tablet 1   carboxymethylcellulose (REFRESH PLUS) 0.5 % SOLN Place 1 drop into both eyes in the morning, at noon, in the  evening, and at bedtime.     cetirizine (ZYRTEC) 10 MG tablet Take 10 mg by mouth daily.     topiramate (TOPAMAX) 100 MG tablet Take 1 tablet (100 mg total) by mouth at bedtime. 90 tablet 4   eletriptan (RELPAX) 40 MG tablet TAKE 1 TABLET BY MOUTH EVERY DAY AS NEEDED FOR HEADACHE-MAY REPEAT IN 2 HOURS IF HEADACHE PERSISTS OR  Max 2 tab in 24 hours 9 tablet 11   Fremanezumab-vfrm (AJOVY) 225 MG/1.5ML SOAJ Inject contents of 1 pen into the skin every 30 days 1.5 mL 11   Rimegepant Sulfate (NURTEC) 75 MG TBDP Take 75 mg by mouth every other day. For migraines. 16 tablet 11   No current facility-administered medications for this visit.    Allergies as of 12/06/2020 - Review Complete 12/06/2020  Allergen Reaction Noted   Celecoxib Hives 10/01/2014   Codeine Nausea And Vomiting 08/30/2012   Morphine Other (See Comments) 10/01/2014   Sumatriptan Other (See Comments) 10/01/2014   Sulfa antibiotics Rash and Hives 08/29/2012    Vitals: BP 138/74   Pulse 76   Ht 5\' 1"  (1.549 m)   Wt 143 lb 6.4 oz (65 kg)   BMI 27.10 kg/m  Last Weight:  Wt Readings from Last 1 Encounters:  12/06/20 143 lb 6.4 oz (65 kg)   Last Height:   Ht Readings from Last 1 Encounters:  12/06/20 5\' 1"  (1.549 m)   Exam: NAD, pleasant                  Speech:    Speech is normal; fluent and spontaneous with normal comprehension.  Cognition:    The patient is oriented to person, place, and time;     recent and remote memory intact;     language fluent;    Cranial Nerves:    The pupils are equal, round, and reactive to  light.Trigeminal sensation is intact and the muscles of mastication are normal. The face is symmetric. The palate elevates in the midline. Hearing intact. Voice is normal. Shoulder shrug is normal. The tongue has normal motion without fasciculations.   Coordination:  No dysmetria  Motor Observation:    No asymmetry, no atrophy, and no involuntary movements noted. Tone:    Normal muscle tone.     Strength:    Strength is V/V in the upper and lower limbs.      Sensation: intact to LT    Assessment and plan: 65 y.o. year old female here for f/up of headache. She is on Topamax and Relpax, ajovy.  She also has bruxism. Ajovy Had  helped from daily headaches and migraines to mild headaches 1/2 the month and only 3-4 migraines a month. She has a lot of stress. Lately more stress and dry eyes, we talked a lot about options, back to daily migraines but now on the left possible due to dry eyes she is working with ophthalmology.   She declines botox.Continue Topamax.  Smoking: discussed cessation  Morning headaches: get a mouth guard, see dentist. She still declines imaging and sleep study  Topamax has made mouth dry. 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 Offered PT for TMJ/Bruxism she declines. Will increase for migraines.  Try nurtec qod  Continue eletriptan  Discussed stopping Ajovy, may be helping still though, she declines. Continue  Consider propranolol next at bedtime  She doesn't want to schedule f/u right now,  will send me mychart message in a few months  She stopped the prozac, it helped in the past but made her tired, suggested f/u with pcp  Had flexeril at bedtime for clenching, not taking it anymore  To prevent or relieve headaches, try the following: Cool Compress. Lie down and place a cool compress on your head.  Avoid headache triggers. If certain foods or odors seem to have triggered your  migraines in the past, avoid them. A headache diary might help you identify triggers.  Include physical activity in your daily routine. Try a daily walk or other moderate aerobic exercise.  Manage stress. Find healthy ways to cope with the stressors, such as delegating tasks on your to-do list.  Practice relaxation techniques. Try deep breathing, yoga, massage and visualization.  Eat regularly. Eating regularly scheduled meals and maintaining a healthy diet might help prevent headaches. Also, drink plenty of fluids.  Follow a regular sleep schedule. Sleep deprivation might contribute to headaches Consider biofeedback. With this mind-body technique, you learn to control certain bodily functions -- such as muscle tension, heart rate and blood pressure -- to prevent headaches or reduce headache pain.    Proceed to emergency room if you experience new or worsening symptoms or symptoms do not resolve, if you have new neurologic symptoms or if headache is severe, or for any concerning symptom.   Provided education and documentation from American headache Society toolbox including articles on: chronic migraine medication overuse headache, chronic migraines, prevention of migraines, behavioral and other nonpharmacologic treatments for headache.   Meds ordered this encounter  Medications   DISCONTD: topiramate (TOPAMAX) 100 MG tablet    Sig: Take 1 tablet (100 mg total) by mouth 2 (two) times daily.    Dispense:  45 tablet    Refill:  3   topiramate (TOPAMAX) 100 MG tablet    Sig: Take 1 tablet (100 mg total) by mouth at bedtime.    Dispense:  90 tablet    Refill:  4    Please do not fill the BID topiramate. Please fill this one.   Rimegepant Sulfate (NURTEC) 75 MG TBDP    Sig: Take 75 mg by mouth every other day. For migraines.    Dispense:  16 tablet    Refill:  11   Fremanezumab-vfrm (AJOVY) 225 MG/1.5ML SOAJ    Sig: Inject contents of 1 pen into the skin every 30 days    Dispense:  1.5 mL     Refill:  11   eletriptan (RELPAX) 40 MG tablet    Sig: TAKE 1 TABLET BY MOUTH EVERY DAY AS NEEDED FOR HEADACHE-MAY REPEAT IN 2 HOURS IF HEADACHE PERSISTS OR  Max 2 tab in 24 hours    Dispense:  9 tablet    Refill:  11   I spent over 30  minutes of face-to-face and non-face-to-face time with patient on the  1. Chronic migraine without aura, with intractable migraine, so stated, with status migrainosus   2. Stress   3. Dry eyes   4. Bruxism   5. Tension headache   6. Dry eye    diagnosis.  This included previsit chart review, lab review, study review, order entry, electronic health record documentation, patient education on the different diagnostic and therapeutic options, counseling and coordination of care, risks and benefits of management, compliance, or risk factor reduction   Naomie Dean, MD  [facility] [address] [place], [place] [address]  Phone [phone] Fax [phone]

## 2021-01-08 ENCOUNTER — Other Ambulatory Visit: Payer: Self-pay | Admitting: Neurology

## 2021-09-20 DIAGNOSIS — J069 Acute upper respiratory infection, unspecified: Secondary | ICD-10-CM | POA: Diagnosis not present

## 2021-09-20 DIAGNOSIS — R059 Cough, unspecified: Secondary | ICD-10-CM | POA: Diagnosis not present

## 2021-12-08 ENCOUNTER — Telehealth: Payer: Self-pay | Admitting: Neurology

## 2021-12-08 ENCOUNTER — Other Ambulatory Visit: Payer: Self-pay | Admitting: Neurology

## 2021-12-08 MED ORDER — TOPIRAMATE 100 MG PO TABS: 100.0000 mg | ORAL_TABLET | Freq: Every evening | ORAL | 0 refills | Status: DC

## 2021-12-08 MED ORDER — NURTEC 75 MG PO TBDP: 75.0000 mg | ORAL_TABLET | ORAL | 1 refills | Status: DC

## 2021-12-08 NOTE — Telephone Encounter (Signed)
Refill sent to hold pt until appt on 02/07/22.

## 2021-12-08 NOTE — Telephone Encounter (Signed)
Pt request refill for topiramate (TOPAMAX) 100 MG tablet and Rimegepant Sulfate (NURTEC) 75 MG TBDP at [facility] [phone]   Scheduled appt on 02/07/22 at 9:30 am

## 2021-12-19 NOTE — Telephone Encounter (Signed)
Received PA request for nurtec. Completed via CMM. Sent to [facility]. Should have a determination within 3-5 business days. Key: [id number].

## 2021-12-20 DIAGNOSIS — H70002 Acute mastoiditis without complications, left ear: Secondary | ICD-10-CM | POA: Diagnosis not present

## 2021-12-20 NOTE — Telephone Encounter (Signed)
Nurtec 67m QoD was denied. Received this notice from [employer]: "We cover this drug when our criteria are met. The unmet criteria are: has less than a 50% reduction in migraine headache days per month (i.e., inadequate response) after previous treatment with TWO of the following: Aimovig, Emgality, or Qulipta. This decision was from [employer] Nurtec ODT (rimegepant) Pharmacy Coverage Policy."

## 2021-12-29 DIAGNOSIS — K1120 Sialoadenitis, unspecified: Secondary | ICD-10-CM | POA: Diagnosis not present

## 2022-02-07 ENCOUNTER — Encounter: Payer: Self-pay | Admitting: Neurology

## 2022-02-07 ENCOUNTER — Ambulatory Visit: Payer: Medicare PPO | Admitting: Neurology

## 2022-02-07 VITALS — BP 124/81 | HR 84 | Ht 61.0 in | Wt 138.0 lb

## 2022-02-07 DIAGNOSIS — G43709 Chronic migraine without aura, not intractable, without status migrainosus: Secondary | ICD-10-CM

## 2022-02-07 MED ORDER — ELETRIPTAN HYDROBROMIDE 40 MG PO TABS: ORAL_TABLET | ORAL | 11 refills | Status: DC

## 2022-02-07 MED ORDER — EMGALITY 120 MG/ML ~~LOC~~ SOAJ: 240.0000 mg | SUBCUTANEOUS | 11 refills | Status: DC

## 2022-02-07 MED ORDER — UBRELVY 100 MG PO TABS: 100.0000 mg | ORAL_TABLET | ORAL | 0 refills | Status: DC | PRN

## 2022-02-07 NOTE — Patient Instructions (Addendum)
Emgality once a month trial - if it works we do not have to go to vyepti We will get Vyepti approved for chronic migraines TRY ubrelvy at onset of migraine as needed as trial to see if you like that We will try and get your nurtec approved as needed  Eptinezumab Injection What is this medication? EPTINEZUMAB (EP ti NEZ ue mab) prevents migraines. It works by blocking a substance in the body that causes migraines. It is a monoclonal antibody. This medicine may be used for other purposes; ask your health care provider or pharmacist if you have questions. COMMON BRAND NAME(S): Vyepti What should I tell my care team before I take this medication? They need to know if you have any of these conditions: An unusual or allergic reaction to eptinezumab, other medications, foods, dyes, or preservatives Pregnant or trying to get pregnant Breast-feeding How should I use this medication? This medication is injected into a vein. It is given by your care team in a hospital or clinic setting. Talk to your care team about the use of this medication in children. Special care may be needed. Overdosage: If you think you have taken too much of this medicine contact a poison control center or emergency room at once. NOTE: This medicine is only for you. Do not share this medicine with others. What if I miss a dose? Keep appointments for follow-up doses. It is important not to miss your dose. Call your care team if you are unable to keep an appointment. What may interact with this medication? Interactions are not expected. This list may not describe all possible interactions. Give your health care provider a list of all the medicines, herbs, non-prescription drugs, or dietary supplements you use. Also tell them if you smoke, drink alcohol, or use illegal drugs. Some items may interact with your medicine. What should I watch for while using this medication? Your condition will be monitored carefully while you are  receiving this medication. What side effects may I notice from receiving this medication? Side effects that you should report to your care team as soon as possible: Allergic reactions or angioedema--skin rash, itching or hives, swelling of the face, eyes, lips, tongue, arms, or legs, trouble swallowing or breathing Side effects that usually do not require medical attention (report to your care team if they continue or are bothersome): Runny or stuffy nose This list may not describe all possible side effects. Call your doctor for medical advice about side effects. You may report side effects to FDA at 1-800-FDA-1088. Where should I keep my medication? This medication is given in a hospital or clinic. It will not be stored at home. NOTE: This sheet is a summary. It may not cover all possible information. If you have questions about this medicine, talk to your doctor, pharmacist, or health care provider.  2023 Else[employer]/Gold Standard (2021-05-16 00:00:00)  Galcanezumab Injection What is this medication? GALCANEZUMAB (gal ka NEZ ue mab) prevents migraines. It works by blocking a substance in the body that causes migraines. It may also be used to treat cluster headaches. It is a monoclonal antibody. This medicine may be used for other purposes; ask your health care provider or pharmacist if you have questions. COMMON BRAND NAME(S): Emgality What should I tell my care team before I take this medication? They need to know if you have any of these conditions: An unusual or allergic reaction to galcanezumab, other medications, foods, dyes, or preservatives Pregnant or trying to get pregnant Breast-feeding How  should I use this medication? This medication is injected under the skin. You will be taught how to prepare and give it. Take it as directed on the prescription label. Keep taking it unless your care team tells you to stop. It is important that you put your used needles and syringes in a special  sharps container. Do not put them in a trash can. If you do not have a sharps container, call your pharmacist or care team to get one. Talk to your care team about the use of this medication in children. Special care may be needed. Overdosage: If you think you have taken too much of this medicine contact a poison control center or emergency room at once. NOTE: This medicine is only for you. Do not share this medicine with others. What if I miss a dose? If you miss a dose, take it as soon as you can. If it is almost time for your next dose, take only that dose. Do not take double or extra doses. What may interact with this medication? Interactions are not expected. This list may not describe all possible interactions. Give your health care provider a list of all the medicines, herbs, non-prescription drugs, or dietary supplements you use. Also tell them if you smoke, drink alcohol, or use illegal drugs. Some items may interact with your medicine. What should I watch for while using this medication? Visit your care team for regular checks on your progress. Tell your care team if your symptoms do not start to get better or if they get worse. What side effects may I notice from receiving this medication? Side effects that you should report to your care team as soon as possible: Allergic reactions or angioedema--skin rash, itching or hives, swelling of the face, eyes, lips, tongue, arms, or legs, trouble swallowing or breathing Side effects that usually do not require medical attention (report to your care team if they continue or are bothersome): Pain, redness, or irritation at injection site This list may not describe all possible side effects. Call your doctor for medical advice about side effects. You may report side effects to FDA at 1-800-FDA-1088. Where should I keep my medication? Keep out of the reach of children and pets. Store in a refrigerator or at room temperature between 20 and 25  degrees C (68 and 77 degrees F). Refrigeration (preferred): Store in the refrigerator. Do not freeze. Keep in the original container until you are ready to take it. Remove the dose from the carton about 30 minutes before it is time for you to use it. If the dose is not used, it may be stored in original container at room temperature for 7 days. Get rid of any unused medication after the expiration date. Room Temperature: This medication may be stored at room temperature for up to 7 days. Keep it in the original container. Protect from light until time of use. If it is stored at room temperature, get rid of any unused medication after 7 days or after it expires, whichever is first. To get rid of medications that are no longer needed or have expired: Take the medication to a medication take-back program. Check with your pharmacy or law enforcement to find a location. If you cannot return the medication, ask your pharmacist or care team how to get rid of this medication safely. NOTE: This sheet is a summary. It may not cover all possible information. If you have questions about this medicine, talk to your doctor, pharmacist, or  health care provider.  2023 Elsevier/Gold Standard (2021-04-14 00:00:00) Ubrogepant Tablets What is this medication? UBROGEPANT (ue BROE je pant) treats migraines. It works by blocking a substance in the body that causes migraines. It is not used to prevent migraines. This medicine may be used for other purposes; ask your health care provider or pharmacist if you have questions. COMMON BRAND NAME(S): Bernita Raisin What should I tell my care team before I take this medication? They need to know if you have any of these conditions: Kidney disease Liver disease An unusual or allergic reaction to ubrogepant, other medications, foods, dyes, or preservatives Pregnant or trying to get pregnant Breast-feeding How should I use this medication? Take this medication by mouth with a glass of  water. Take it as directed on the prescription label. You can take it with or without food. If it upsets your stomach, take it with food. Keep taking it unless your care team tells you to stop. Talk to your care team about the use of this medication in children. Special care may be needed. Overdosage: If you think you have taken too much of this medicine contact a poison control center or emergency room at once. NOTE: This medicine is only for you. Do not share this medicine with others. What if I miss a dose? This does not apply. This medication is not for regular use. What may interact with this medication? Do not take this medication with any of the following: Adagrasib Ceritinib Certain antibiotics, such as chloramphenicol, clarithromycin, telithromycin Certain antivirals for HIV, such as atazanavir, cobicistat, darunavir, delavirdine, fosamprenavir, indinavir, ritonavir Certain medications for fungal infections, such as itraconazole, ketoconazole, posaconazole, voriconazole Conivaptan Grapefruit Idelalisib Mifepristone Nefazodone Ribociclib This medication may also interact with the following: Carvedilol Certain medications for seizures, such as phenobarbital, phenytoin Ciprofloxacin Cyclosporine Eltrombopag Fluconazole Fluvoxamine Quinidine Rifampin St. John's wort Verapamil This list may not describe all possible interactions. Give your health care provider a list of all the medicines, herbs, non-prescription drugs, or dietary supplements you use. Also tell them if you smoke, drink alcohol, or use illegal drugs. Some items may interact with your medicine. What should I watch for while using this medication? Visit your care team for regular checks on your progress. Tell your care team if your symptoms do not start to get better or if they get worse. Your mouth may get dry. Chewing sugarless gum or sucking hard candy and drinking plenty of water may help. Contact your care team  if the problem does not go away or is severe. What side effects may I notice from receiving this medication? Side effects that you should report to your care team as soon as possible: Allergic reactions--skin rash, itching, hives, swelling of the face, lips, tongue, or throat Side effects that usually do not require medical attention (report to your care team if they continue or are bothersome): Drowsiness Dry mouth Fatigue Nausea This list may not describe all possible side effects. Call your doctor for medical advice about side effects. You may report side effects to FDA at 1-800-FDA-1088. Where should I keep my medication? Keep out of the reach of children and pets. Store between 15 and 30 degrees C (59 and 86 degrees F). Get rid of any unused medication after the expiration date. To get rid of medications that are no longer needed or have expired: Take the medication to a medication take-back program. Check with your pharmacy or law enforcement to find a location. If you cannot return the medication, check  the label or package insert to see if the medication should be thrown out in the garbage or flushed down the toilet. If you are not sure, ask your care team. If it is safe to put it in the trash, pour the medication out of the container. Mix the medication with cat litter, dirt, coffee grounds, or other unwanted substance. Seal the mixture in a bag or container. Put it in the trash. NOTE: This sheet is a summary. It may not cover all possible information. If you have questions about this medicine, talk to your doctor, pharmacist, or health care provider.  2023 Elsevier/Gold Standard (2021-05-18 00:00:00)

## 2022-02-07 NOTE — Progress Notes (Unsigned)
[facility]   Provider:  Dr Lucia Gaskins Referring Provider: Assunta Found, MD Primary Care Physician:  [facility]   CC:  Migraine  Interval history 02/07/2022: Paula Trevino made a slight difference but not significant. Nurtec helps acutelly but insurance declined. She is having 15 migraine days a month on average.  The migraines are pulsating pounding throbbing, photophobia phonophobia, nausea, can be unilateral, can last 24 to 72 hours but have lasted up to 3 weeks, moderate to severe, no medication overuse, no aura,   she has tried and failed a plethora of medications including: Nurtec worked great > 50% response, Less than 50% response or side effects to the following: Aimovig contraindicated due to constipation, she tried 3 months of Emgality samples and was on Ajovy for many months without significant improvement, she had side effects to samples of qulipta, she has tried tylenol, ibuprofen, excedrinm benadryl, relpax, flexeril, prozac, propranolol contraindicated due to hypotension and bradycardia, zofran, phenergan, imitrex, topamax, depakote, ubrelvy, amitriptyline (side effects),   Patient complains of symptoms per HPI as well as the following symptoms: migraine . Pertinent negatives and positives per HPI. All others negative   Interval history 12/06/2020: This past December she started having dry eyes, that worsened her migraines. Not on the right where they always used to be. Now more on the left and due to severe pain in the eye and the left eye is so dry the left eye is causing her migraines. The drops help. Ajovy not helping. She is still not willing to do a sleep study. She stays tired all the time. This is all because of dry eyes.   Discussed propranolol at bedtime for migraine prevention and increase Topamax. We talked about nurtec every other day.   Interval history 11/26/2019: Baseline:  lingering daily headaches, at last appointment was having daily  migraines. She is not waking up with the headaches every morning but still lingering 15 headache days a month, 3-4 migraines a month, zyrtec has helped, she has had some improvement with Ajovy, she had an excruciating headache after the covid shot, We discussed triptans and the new medications such as nurtec. In the past recommended sleep evaluation was recommended and she has declined. She has a lot of stress in her life and feel this affects her. She is on Topamax. She declines botox. The headaches are mild 1/2 the month. Asked her to keep a migraine diary. Her migraines start on the right, spreads to the back, pulsating/pounding, light and sound sensitivity, nausea. The other headaches are more pressure. Will give samples today of nurtec. She wants to stay on the ajovy. She is still smoking, she has a lot of stress. She has cut back.  Interval history 11/25/2018: patient here for follow up of headaches and migraines last seen approx. One year ago.She has been on topamax with good results.  Recommended sleep eval for morning headaches and she declined. For her smoking I started her on Wellbutrin and recommended smoking counseling in the past.  Since July her migraines are worse. She wakes with headaches and through the day she is getting daily headaches and migraines. She has daily dull headaches. Discussed management, will continue Topamax and Eletriptan. Significant history of most women on her mother's side and daughter.   Interval history 11/20/2017: Patient is here, follow up on migraines. Recommended sleep eval but she never complied. She is on Topamax, relpax, flexeril, still smoking. There is stress with her daughter who has pain after c-section. They  had to go to [place] to find someone to help. The Topamax is helping, she has a "tense head" moreso to stress. Discussed, will start Prozac in addition to the wellbutrin.    Interval history 10/03/2016: Patient is here for yearly follow-up of migraines.  She has done well on Topamax, Relpax, Flexeril. Recommend sleep eval but she never complied. Had a long discussion about smoking cessation, started her on Wellbutrin and recommended hypnotist.  Not much has changed. She went for 8 months without smoking, she had a relapse in April but quit in the last week and back on the wellbutrin. Worse in the spring. She has hadaches often during the month little mild headache and severe migraines are once every 2 months but monthly 4 migraines but needs . Relpax helps when she takes it for migraines. Takes tylenol occasionally, she really don;t keep track. Asked her to keep a migraine diary. 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  Interval history 10/04/2015:  Here for follow up of migraines. She never had her sleep eval, discussed untreated sleep apnea can cause increased risk of stroke and other sequelae. Her migraines are feeling better. She is terrified that she has cancer, she has been smoking so long, she wants to quit. Discussed at length. Will try her on Wellbutrin. Recommended hypnotist who performs this for smoking. Also recommended therapy for smoking cessation. Discussed other medications and side effects, chantix for example. Decided on Wellbutrin. Discussed side effects as detailed in the patient instructions. Asked her to use my chart to keep me informed of her progress. She clenches her jaw at night, may be stress, has seen a dentist and can't afford the mouthguard.     Interval History 10/01/2014:  Paula Trevino is a 66 y.o. female here as a follow up for migraine. She is transitioning to my care.She is waking up with headachesand thinks she is clenching her teeth or snoring. She is going to the dentist to see if teeth clenching is causing her headaches. She feels lightheaded and some tingling every once in a while but otherwise no side effects  from the Topiramate. She takes one Topiramate the morning and 2 at night. Relpax helps with acute management. Snores at night. Excessive daytime sleepiness. Morning headache. ESS 14.   Interval History 03/27/2014:  When she is not drinking enough water she gets burning urination.  She is not sleeping well. She used to take Xanax. She is not getting any migraines. Has some tingling in the fingers occasionally but otherwise doing well.  She falls asleep but she wakes up at midnight and can't go to sleep for hours. Doesn't know why she is waking up, possibly to urinate then can't go back to sleep. She used to take Xanax every night but got off of it. She is not a good sleeper.     09/29/13 (LL):  Since last visit 6 months ago, headaches have picked up again in frequency as weather has gotten hotter.  Side effects of TPX diminished.  She is having more frequent headaches that have a more tight, pressure quality, but not many severe headaches.  Overall much better than before going on TPX.  She has not had great response from Sumatriptan; always needing second dose and not fully relieving headache.  Her daughter has had good response from Relpax.   03/10/13 (LL): Patient returns for 2  month follow up for Migraines, they have greatly improved on TPX. She is having some tingling in fingers and occasional facial numbness, balance seems off, nausea first few weeks (resolved). She is happy not to have severe headache though. Still having 1-2 milder headaches, treating with ibuprofen. She has not had to use Sumatriptan. She is happy with current treatment.    01/07/13 (VP): 66 year old right-handed female here for a evaluation of headaches, recurrent since 2012, but worsened since July 2014. Patient was in her 62s, she had developed right-sided headaches with photophobia, pulsating and throbbing sensation. She took over-the-counter medications with good relief. However, she was not officially diagnosed with migraine. By  late 30s and 40s, she had stopped having headaches. 2012 headaches returned. Since summer 2014, headaches have been significantly worsen. She had recurrent right-sided throbbing severe headaches with retro-orbital pain, nausea, photophobia. She was sensitive to smells. She's been taking Excedrin Migraine, Benadryl, Motrin, Tylenol as a for headaches. Patient went to the emergency room one time because of severe headaches, received headache cocktails and CT scan head which are unremarkable. Since then she's had lingering daily headaches. She has 2-3 severe headaches per week. Strong family history of migraine headaches in patient's sister, daughter, maternal great aunt. Patient is never tried anti-migraine medications in the past. No specific triggering factors   Review of Systems: Patient complains of symptoms per HPI as well as the following symptoms: stress . Pertinent negatives and positives per HPI. All others negative     Social History   Socioeconomic History   Marital status: Widowed    Spouse name: Not on file   Number of children: 1   Years of education: BS   Highest education level: Not on file  Occupational History   Occupation: Retired    Comment: Runner, broadcasting/film/video, retired in 2014  Tobacco Use   Smoking status: Some Days    Packs/day: 0.25    Types: Cigarettes   Smokeless tobacco: Never  Vaping Use   Vaping Use: Never used  Substance and Sexual Activity   Alcohol use: Yes    Comment: occasionally wine   Drug use: No   Sexual activity: Never    Birth control/protection: Post-menopausal  Other Topics Concern   Not on file  Social History Narrative   Patient lives at home alone.   Caffeine Use: 1 soda and a few cups of coffee daily   Right handed   Social Determinants of Health   Financial Resource Strain: Not on file  Food Insecurity: Not on file  Transportation Needs: Not on file  Physical Activity: Not on file  Stress: Not on file  Social Connections: Not on file   Intimate Partner Violence: Not on file    Family History  Problem Relation Age of Onset   Hypertension Mother    Cancer Mother    Heart disease Father    Stroke Maternal Grandmother    Heart disease Paternal Grandmother    Cancer Paternal Grandfather        Stomach   Migraines Sister    Migraines Maternal Aunt        great aunt   Migraines Daughter    Other Daughter        severe nerve damage, chronic pain   Colon cancer Neg Hx     Past Medical History:  Diagnosis Date   Arthritis    Colon polyps    Difficulty sleeping    E coli enteritis    IBS (irritable bowel syndrome)  Migraines    Norovirus 06/2017   PONV (postoperative nausea and vomiting)    itching    Past Surgical History:  Procedure Laterality Date   CATARACT EXTRACTION  12/2019   CESAREAN SECTION  1991   CHOLECYSTECTOMY  1997   COLONOSCOPY WITH PROPOFOL N/A 03/18/2020   Procedure: COLONOSCOPY WITH PROPOFOL;  Surgeon: Corbin Ade, MD;  Location: [facility];  Service: Endoscopy;  Laterality: N/A;  9:00am   PARS PLANA VITRECTOMY W/ REPAIR OF MACULAR HOLE     SHOULDER SURGERY      Current Outpatient Medications  Medication Sig Dispense Refill   acetaminophen (TYLENOL) 500 MG tablet Take 500-1,000 mg by mouth every 6 (six) hours as needed for headache.     ALPRAZolam (XANAX) 0.25 MG tablet TAKE 1 TABLET(0.25 MG) BY MOUTH AT BEDTIME AS NEEDED FOR SLEEP 15 tablet 1   carboxymethylcellulose (REFRESH PLUS) 0.5 % SOLN Place 1 drop into both eyes in the morning, at noon, in the evening, and at bedtime.     cetirizine (ZYRTEC) 10 MG tablet Take 10 mg by mouth as needed.     topiramate (TOPAMAX) 100 MG tablet Take 1 tablet (100 mg total) by mouth at bedtime. 60 tablet 0   Ubrogepant (UBRELVY) 100 MG TABS Take 100 mg by mouth every 2 (two) hours as needed. Maximum 200mg  a day. 2 tablet 0   eletriptan (RELPAX) 40 MG tablet TAKE 1 TABLET BY MOUTH EVERY DAY AS NEEDED FOR HEADACHE-MAY REPEAT IN 2 HOURS IF  HEADACHE PERSISTS OR  Max 2 tab in 24 hours 12 tablet 11   No current facility-administered medications for this visit.    Allergies as of 02/07/2022 - Review Complete 02/07/2022  Allergen Reaction Noted   Celecoxib Hives 10/01/2014   Codeine Nausea And Vomiting 08/30/2012   Morphine Other (See Comments) 10/01/2014   Sumatriptan Other (See Comments) 10/01/2014   Sulfa antibiotics Rash and Hives 08/29/2012    Vitals: BP 124/81   Pulse 84   Ht 5\' 1"  (1.549 m)   Wt 138 lb (62.6 kg)   BMI 26.07 kg/m  Last Weight:  Wt Readings from Last 1 Encounters:  02/07/22 138 lb (62.6 kg)   Last Height:   Ht Readings from Last 1 Encounters:  02/07/22 5\' 1"  (1.549 m)   Exam: NAD, pleasant                  Speech:    Speech is normal; fluent and spontaneous with normal comprehension.  Cognition:    The patient is oriented to person, place, and time;     recent and remote memory intact;     language fluent;    Cranial Nerves:    The pupils are equal, round, and reactive to light.Trigeminal sensation is intact and the muscles of mastication are normal. The face is symmetric. The palate elevates in the midline. Hearing intact. Voice is normal. Shoulder shrug is normal. The tongue has normal motion without fasciculations.   Coordination:  No dysmetria  Motor Observation:    No asymmetry, no atrophy, and no involuntary movements noted. Tone:    Normal muscle tone.     Strength:    Strength is V/V in the upper and lower limbs.      Sensation: intact to LT    Assessment and plan: 66 y.o. year old female here for f/up of headache. She is on Topamax and Relpax, ajovy.  She also has bruxism. Ajovy Had  helped from daily  headaches and migraines to mild headaches 1/2 the month and only 3-4 migraines a month. She has a lot of stress. Lately more stress and dry eyes, we talked a lot about options, back to daily migraines but now on the left possible due to dry eyes she is working with  ophthalmology.   Daily migraines: Emgality once a month trial - if it works we do not have to go to vyepti, gave samples. Failed ajovy and aimovig and plethora of meds.  We will get Vyepti approved for chronic migraines TRY ubrelvy at onset of migraine as needed as trial to see if you like that gave samples, maybe if she gets better on vyepti we can get her Bernita Raisin We will try and get your nurtec approved as needed  Meds ordered this encounter  Medications   DISCONTD: Galcanezumab-gnlm (EMGALITY) 120 MG/ML SOAJ    Sig: Inject 240 mg into the skin every 30 (thirty) days.    Dispense:  1.12 mL    Refill:  11   Ubrogepant (UBRELVY) 100 MG TABS    Sig: Take 100 mg by mouth every 2 (two) hours as needed. Maximum 200mg  a day.    Dispense:  2 tablet    Refill:  0   eletriptan (RELPAX) 40 MG tablet    Sig: TAKE 1 TABLET BY MOUTH EVERY DAY AS NEEDED FOR HEADACHE-MAY REPEAT IN 2 HOURS IF HEADACHE PERSISTS OR  Max 2 tab in 24 hours    Dispense:  12 tablet    Refill:  11    If insurance will only pay for 9 then please dispense 9 unless patient has coupon or wants to pay out of pocket    She declines botox.Continue Topamax.  Smoking: discussed cessation  Morning headaches: get a mouth guard, see dentist. She still declines imaging and sleep study  Topamax has made mouth dry. 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 Offered PT for TMJ/Bruxism she declines.   Continue eletriptan  Discussed stopping Ajovy, may be helping still though, she declines.   Consider propranolol next at bedtime  She doesn't want to schedule f/u right now, will send me mychart message in a few months  She stopped the prozac, it helped in the past but made her tired, suggested f/u with pcp  Had flexeril at bedtime for clenching, not taking it anymore  To prevent or relieve headaches, try the following: Cool Compress. Lie down and place a cool  compress on your head.  Avoid headache triggers. If certain foods or odors seem to have triggered your migraines in the past, avoid them. A headache diary might help you identify triggers.  Include physical activity in your daily routine. Try a daily walk or other moderate aerobic exercise.  Manage stress. Find healthy ways to cope with the stressors, such as delegating tasks on your to-do list.  Practice relaxation techniques. Try deep breathing, yoga, massage and visualization.  Eat regularly. Eating regularly scheduled meals and maintaining a healthy diet might help prevent headaches. Also, drink plenty of fluids.  Follow a regular sleep schedule. Sleep deprivation might contribute to headaches Consider biofeedback. With this mind-body technique, you learn to control certain bodily functions -- such as muscle tension, heart rate and blood pressure -- to prevent headaches or reduce headache pain.    Proceed to emergency room if you experience new or worsening symptoms or symptoms do not resolve, if  you have new neurologic symptoms or if headache is severe, or for any concerning symptom.   Provided education and documentation from American headache Society toolbox including articles on: chronic migraine medication overuse headache, chronic migraines, prevention of migraines, behavioral and other nonpharmacologic treatments for headache.  I spent 40 minutes of face-to-face and non-face-to-face time with patient on the  1. Chronic migraine without aura without status migrainosus, not intractable    diagnosis.  This included previsit chart review, lab review, study review, order entry, electronic health record documentation, patient education on the different diagnostic and therapeutic options, counseling and coordination of care, risks and benefits of management, compliance, or risk factor reduction   Paula DeanAntonia Osric Klopf, MD  [facility] [address] [place], [place]  [id number]  Phone [phone] Fax [phone]

## 2022-02-14 ENCOUNTER — Telehealth: Payer: Self-pay | Admitting: *Deleted

## 2022-02-14 NOTE — Telephone Encounter (Signed)
-----   Message from Melvenia Beam, MD sent at 02/09/2022  4:21 PM EDT ----- Can we start vyepti approval please? 100mg  to start and then increase to 300 thanks

## 2022-02-20 ENCOUNTER — Other Ambulatory Visit: Payer: Self-pay | Admitting: Neurology

## 2022-02-20 NOTE — Telephone Encounter (Addendum)
Pt asking to stay on Emgality for a couple of months to see if this works before doing intrafusion. Would like a call  from the nurse.

## 2022-02-20 NOTE — Telephone Encounter (Addendum)
Vyepti order form for Intrafusion at [facility] is pending Dr Trevor Mace signature. I called pt and LVM (ok per DPR) letting her know we would submit for approval here at our office.

## 2022-02-20 NOTE — Telephone Encounter (Signed)
I called the pt back. Looks like she has Emgality samples for the trial. I LVM (ok per DPR) advising that is fine for her to do the trial and if it doesn't work we can submit for [employer]. Will wait to hear back from patient. Asked her to call us if she doesn't have enough Emgality. Left office number in message.

## 2022-04-07 DIAGNOSIS — R059 Cough, unspecified: Secondary | ICD-10-CM | POA: Diagnosis not present

## 2022-04-07 DIAGNOSIS — J069 Acute upper respiratory infection, unspecified: Secondary | ICD-10-CM | POA: Diagnosis not present

## 2022-05-30 DIAGNOSIS — Z1231 Encounter for screening mammogram for malignant neoplasm of breast: Secondary | ICD-10-CM | POA: Diagnosis not present

## 2022-07-05 ENCOUNTER — Telehealth: Payer: Self-pay | Admitting: Neurology

## 2022-07-05 NOTE — Telephone Encounter (Signed)
Pt admits that she has dropped the ball re: Emgality, she would like a call to discuss starting the process.

## 2022-07-05 NOTE — Telephone Encounter (Signed)
I called pt.  She has had a lot going on in her life and let herself go by the wayside.  She is wanting to restart the process for emaglity.  Last dose was in January she said.  Ok to just start with 120mg  /ml emgality to do the loading dose?

## 2022-07-06 MED ORDER — EMGALITY 120 MG/ML ~~LOC~~ SOAJ: 120.0000 mg | SUBCUTANEOUS | 11 refills | Status: DC

## 2022-07-06 NOTE — Telephone Encounter (Signed)
Prescription ordered emgality 120mg  /ml every 30 days.  Will need PA.

## 2022-07-06 NOTE — Addendum Note (Signed)
Addended by: Brandon Melnick on: 07/06/2022 02:22 PM   Modules accepted: Orders

## 2022-07-06 NOTE — Telephone Encounter (Signed)
Yes, thanks

## 2022-07-10 ENCOUNTER — Ambulatory Visit: Payer: PRIVATE HEALTH INSURANCE | Admitting: Neurology

## 2022-07-11 ENCOUNTER — Telehealth: Payer: Self-pay | Admitting: *Deleted

## 2022-07-11 NOTE — Telephone Encounter (Signed)
Completed Emgality PA on CMM. Key: [id number]. Awaiting determination from [facility].

## 2022-08-08 DIAGNOSIS — Z139 Encounter for screening, unspecified: Secondary | ICD-10-CM | POA: Diagnosis not present

## 2022-08-15 DIAGNOSIS — R7303 Prediabetes: Secondary | ICD-10-CM | POA: Diagnosis not present

## 2022-08-15 DIAGNOSIS — E663 Overweight: Secondary | ICD-10-CM | POA: Diagnosis not present

## 2022-08-15 DIAGNOSIS — G43909 Migraine, unspecified, not intractable, without status migrainosus: Secondary | ICD-10-CM | POA: Diagnosis not present

## 2022-08-15 DIAGNOSIS — Z6827 Body mass index (BMI) 27.0-27.9, adult: Secondary | ICD-10-CM | POA: Diagnosis not present

## 2022-08-15 DIAGNOSIS — Z713 Dietary counseling and surveillance: Secondary | ICD-10-CM | POA: Diagnosis not present

## 2022-09-09 ENCOUNTER — Other Ambulatory Visit: Payer: Self-pay | Admitting: Neurology

## 2022-09-26 ENCOUNTER — Encounter: Payer: Self-pay | Admitting: Neurology

## 2022-09-26 ENCOUNTER — Ambulatory Visit: Payer: Medicare PPO | Admitting: Neurology

## 2022-09-26 VITALS — BP 122/72 | HR 82 | Ht 61.0 in

## 2022-09-26 DIAGNOSIS — G43709 Chronic migraine without aura, not intractable, without status migrainosus: Secondary | ICD-10-CM | POA: Diagnosis not present

## 2022-09-26 DIAGNOSIS — G4700 Insomnia, unspecified: Secondary | ICD-10-CM | POA: Diagnosis not present

## 2022-09-26 MED ORDER — BELSOMRA 20 MG PO TABS: 20.0000 mg | ORAL_TABLET | Freq: Every day | ORAL | 5 refills | Status: AC

## 2022-09-26 MED ORDER — TOPIRAMATE 25 MG PO TABS: 25.0000 mg | ORAL_TABLET | Freq: Every day | ORAL | 6 refills | Status: DC

## 2022-09-26 MED ORDER — ELETRIPTAN HYDROBROMIDE 40 MG PO TABS: ORAL_TABLET | ORAL | 11 refills | Status: DC

## 2022-09-26 MED ORDER — EMGALITY 120 MG/ML ~~LOC~~ SOAJ: 120.0000 mg | SUBCUTANEOUS | 11 refills | Status: DC

## 2022-09-26 MED ORDER — NURTEC 75 MG PO TBDP: 75.0000 mg | ORAL_TABLET | Freq: Every day | ORAL | 6 refills | Status: DC | PRN

## 2022-09-26 MED ORDER — TOPIRAMATE 25 MG PO TABS: 75.0000 mg | ORAL_TABLET | Freq: Every evening | ORAL | 11 refills | Status: DC | PRN

## 2022-09-26 NOTE — Progress Notes (Signed)
[facility]   Provider:  Dr Lucia Gaskins Referring Provider: Assunta Found, MD Primary Care Physician:  [facility]   CC:  Migraine  09/26/2022: Not sleeping well,  initiates sleep well but wakes 130-4 and then up at 6am. Emgality was approved. She is doing well on emgality having not that many migraines a month taking a lot of nurtec this seaseon this is her worst time 2x a month. Eletriptan helps. She takes Nurtec. Can try to decrease the topiramate to 75mg  and see. She takes nurtec as needed. Will try eletriptan then try nurtec. Will go down on topiramate 60m and see if it makes a difference. On emgality barely has a migraine maybe 5 mild migraines and a few headaches but overall < 8 total headache days a month from daily migraines, doin gextraordinary well, emgality great, > 80% improvement in migraine and headache freq and severity. Track migraines. Next would try nurtec prn and vyepti. We discussed all her meds, meds to sleep, decreasing topamax, other options like vyepti, nurtec and relpax prn. No other focal neurologic deficits, associated symptoms, inciting events or modifiable factors.  Patient complains of symptoms per HPI as well as the following symptoms: none . Pertinent negatives and positives per HPI. All others negative    Interval history 02/07/2022: Ajovy made a slight difference but not significant. Nurtec helps acutelly but insurance declined. She is having >15 migraine days a month on average.  The migraines are pulsating pounding throbbing, photophobia phonophobia, nausea, can be unilateral, can last 24 to 72 hours but have lasted up to 3 weeks, moderate to severe, no medication overuse, no aura,   she has tried and failed a plethora of medications including: Nurtec worked great > 50% response, Less than 50% response or side effects to the following: Aimovig contraindicated due to constipation, she tried 3 months of Emgality samples and was on  Ajovy for many months without significant improvement, Emgality works great, she had side effects to samples of qulipta, she has tried tylenol, ibuprofen, excedrinm benadryl, relpax, flexeril, prozac, propranolol contraindicated due to hypotension and bradycardia, zofran, phenergan, imitrex, topamax, depakote, ubrelvy, amitriptyline (side effects),   Patient complains of symptoms per HPI as well as the following symptoms: migraine . Pertinent negatives and positives per HPI. All others negative   Interval history 12/06/2020: This past December she started having dry eyes, that worsened her migraines. Not on the right where they always used to be. Now more on the left and due to severe pain in the eye and the left eye is so dry the left eye is causing her migraines. The drops help. Ajovy not helping. She is still not willing to do a sleep study. She stays tired all the time. This is all because of dry eyes.   Discussed propranolol at bedtime for migraine prevention and increase Topamax. We talked about nurtec every other day.   Interval history 11/26/2019: Baseline:  lingering daily headaches, at last appointment was having daily migraines. She is not waking up with the headaches every morning but still lingering 15 headache days a month, 3-4 migraines a month, zyrtec has helped, she has had some improvement with Ajovy, she had an excruciating headache after the covid shot, We discussed triptans and the new medications such as nurtec. In the past recommended sleep evaluation was recommended and she has declined. She has a lot of stress in her life and feel this affects her. She is on Topamax. She declines botox. The headaches  are mild 1/2 the month. Asked her to keep a migraine diary. Her migraines start on the right, spreads to the back, pulsating/pounding, light and sound sensitivity, nausea. The other headaches are more pressure. Will give samples today of nurtec. She wants to stay on the ajovy. She is  still smoking, she has a lot of stress. She has cut back.  Interval history 11/25/2018: patient here for follow up of headaches and migraines last seen approx. One year ago.She has been on topamax with good results.  Recommended sleep eval for morning headaches and she declined. For her smoking I started her on Wellbutrin and recommended smoking counseling in the past.  Since July her migraines are worse. She wakes with headaches and through the day she is getting daily headaches and migraines. She has daily dull headaches. Discussed management, will continue Topamax and Eletriptan. Significant history of most women on her mother's side and daughter.   Interval history 11/20/2017: Patient is here, follow up on migraines. Recommended sleep eval but she never complied. She is on Topamax, relpax, flexeril, still smoking. There is stress with her daughter who has pain after c-section. They had to go to [place] to find someone to help. The Topamax is helping, she has a "tense head" moreso to stress. Discussed, will start Prozac in addition to the wellbutrin.    Interval history 10/03/2016: Patient is here for yearly follow-up of migraines. She has done well on Topamax, Relpax, Flexeril. Recommend sleep eval but she never complied. Had a long discussion about smoking cessation, started her on Wellbutrin and recommended hypnotist.  Not much has changed. She went for 8 months without smoking, she had a relapse in April but quit in the last week and back on the wellbutrin. Worse in the spring. She has hadaches often during the month little mild headache and severe migraines are once every 2 months but monthly 4 migraines but needs . Relpax helps when she takes it for migraines. Takes tylenol occasionally, she really don;t keep track. Asked her to keep a migraine diary. 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  Interval history 10/04/2015:  Here for follow up of migraines. She never had her sleep eval, discussed untreated sleep apnea can cause increased risk of stroke and other sequelae. Her migraines are feeling better. She is terrified that she has cancer, she has been smoking so long, she wants to quit. Discussed at length. Will try her on Wellbutrin. Recommended hypnotist who performs this for smoking. Also recommended therapy for smoking cessation. Discussed other medications and side effects, chantix for example. Decided on Wellbutrin. Discussed side effects as detailed in the patient instructions. Asked her to use my chart to keep me informed of her progress. She clenches her jaw at night, may be stress, has seen a dentist and can't afford the mouthguard.     Interval History 10/01/2014:  LEXXI ZACHRY is a 66 y.o. female here as a follow up for migraine. She is transitioning to my care.She is waking up with headachesand thinks she is clenching her teeth or snoring. She is going to the dentist to see if teeth clenching is causing her headaches. She feels lightheaded and some tingling every once in a while but otherwise no side effects from the Topiramate. She takes one Topiramate the morning and 2 at night. Relpax helps with acute management. Snores at night. Excessive  daytime sleepiness. Morning headache. ESS 14.   Interval History 03/27/2014:  When she is not drinking enough water she gets burning urination.  She is not sleeping well. She used to take Xanax. She is not getting any migraines. Has some tingling in the fingers occasionally but otherwise doing well.  She falls asleep but she wakes up at midnight and can't go to sleep for hours. Doesn't know why she is waking up, possibly to urinate then can't go back to sleep. She used to take Xanax every night but got off of it. She is not a good sleeper.     09/29/13 (LL):  Since last visit 6 months ago, headaches have picked up again in frequency as  weather has gotten hotter.  Side effects of TPX diminished.  She is having more frequent headaches that have a more tight, pressure quality, but not many severe headaches.  Overall much better than before going on TPX.  She has not had great response from Sumatriptan; always needing second dose and not fully relieving headache.  Her daughter has had good response from Relpax.   03/10/13 (LL): Patient returns for 2 month follow up for Migraines, they have greatly improved on TPX. She is having some tingling in fingers and occasional facial numbness, balance seems off, nausea first few weeks (resolved). She is happy not to have severe headache though. Still having 1-2 milder headaches, treating with ibuprofen. She has not had to use Sumatriptan. She is happy with current treatment.    01/07/13 (VP): 67 year old right-handed female here for a evaluation of headaches, recurrent since 2012, but worsened since July 2014. Patient was in her 76s, she had developed right-sided headaches with photophobia, pulsating and throbbing sensation. She took over-the-counter medications with good relief. However, she was not officially diagnosed with migraine. By late 30s and 40s, she had stopped having headaches. 2012 headaches returned. Since summer 2014, headaches have been significantly worsen. She had recurrent right-sided throbbing severe headaches with retro-orbital pain, nausea, photophobia. She was sensitive to smells. She's been taking Excedrin Migraine, Benadryl, Motrin, Tylenol as a for headaches. Patient went to the emergency room one time because of severe headaches, received headache cocktails and CT scan head which are unremarkable. Since then she's had lingering daily headaches. She has 2-3 severe headaches per week. Strong family history of migraine headaches in patient's sister, daughter, maternal great aunt. Patient is never tried anti-migraine medications in the past. No specific triggering factors   Review of  Systems: Patient complains of symptoms per HPI as well as the following symptoms: stress . Pertinent negatives and positives per HPI. All others negative     Social History   Socioeconomic History   Marital status: Widowed    Spouse name: Not on file   Number of children: 1   Years of education: BS   Highest education level: Not on file  Occupational History   Occupation: Retired    Comment: Runner, broadcasting/film/video, retired in 2014  Tobacco Use   Smoking status: Some Days    Packs/day: .25    Types: Cigarettes   Smokeless tobacco: Never  Vaping Use   Vaping Use: Never used  Substance and Sexual Activity   Alcohol use: Yes    Comment: occasionally wine   Drug use: No   Sexual activity: Never    Birth control/protection: Post-menopausal  Other Topics Concern   Not on file  Social History Narrative   Patient lives at home alone.   Caffeine Use: 1 soda  and a few cups of coffee daily   Right handed   Social Determinants of Health   Financial Resource Strain: Not on file  Food Insecurity: Not on file  Transportation Needs: Not on file  Physical Activity: Not on file  Stress: Not on file  Social Connections: Not on file  Intimate Partner Violence: Not on file    Family History  Problem Relation Age of Onset   Hypertension Mother    Cancer Mother    Heart disease Father    Stroke Maternal Grandmother    Heart disease Paternal Grandmother    Cancer Paternal Grandfather        Stomach   Migraines Sister    Migraines Maternal Aunt        great aunt   Migraines Daughter    Other Daughter        severe nerve damage, chronic pain   Colon cancer Neg Hx     Past Medical History:  Diagnosis Date   Arthritis    Colon polyps    Difficulty sleeping    E coli enteritis    IBS (irritable bowel syndrome)    Migraines    Norovirus 06/2017   PONV (postoperative nausea and vomiting)    itching    Past Surgical History:  Procedure Laterality Date   CATARACT EXTRACTION  12/2019    CESAREAN SECTION  1991   CHOLECYSTECTOMY  1997   COLONOSCOPY WITH PROPOFOL N/A 03/18/2020   Procedure: COLONOSCOPY WITH PROPOFOL;  Surgeon: Corbin Ade, MD;  Location: [facility];  Service: Endoscopy;  Laterality: N/A;  9:00am   PARS PLANA VITRECTOMY W/ REPAIR OF MACULAR HOLE     SHOULDER SURGERY      Current Outpatient Medications  Medication Sig Dispense Refill   Rimegepant Sulfate (NURTEC) 75 MG TBDP Take 1 tablet (75 mg total) by mouth daily as needed. For migraines. Take as close to onset of migraine as possible. One daily maximum. 10 tablet 6   Suvorexant (BELSOMRA) 20 MG TABS Take 1 tablet (20 mg total) by mouth at bedtime. 30 tablet 5   topiramate (TOPAMAX) 25 MG tablet Take 3 tablets (75 mg total) by mouth at bedtime as needed. 90 tablet 11   acetaminophen (TYLENOL) 500 MG tablet Take 500-1,000 mg by mouth every 6 (six) hours as needed for headache.     carboxymethylcellulose (REFRESH PLUS) 0.5 % SOLN Place 1 drop into both eyes in the morning, at noon, in the evening, and at bedtime.     cetirizine (ZYRTEC) 10 MG tablet Take 10 mg by mouth as needed.     eletriptan (RELPAX) 40 MG tablet TAKE 1 TABLET BY MOUTH EVERY DAY AS NEEDED FOR HEADACHE-MAY REPEAT IN 2 HOURS IF HEADACHE PERSISTS OR  Max 2 tab in 24 hours 12 tablet 11   Galcanezumab-gnlm (EMGALITY) 120 MG/ML SOAJ Inject 120 mg into the skin every 30 (thirty) days. 1.12 mL 11   No current facility-administered medications for this visit.    Allergies as of 09/26/2022 - Review Complete 02/07/2022  Allergen Reaction Noted   Celecoxib Hives 10/01/2014   Codeine Nausea And Vomiting 08/30/2012   Morphine Other (See Comments) 10/01/2014   Sumatriptan Other (See Comments) 10/01/2014   Sulfa antibiotics Rash and Hives 08/29/2012    Vitals: There were no vitals taken for this visit. Last Weight:  Wt Readings from Last 1 Encounters:  02/07/22 138 lb (62.6 kg)   Last Height:   Ht Readings from Last 1  Encounters:   02/07/22 5\' 1"  (1.549 m)   Physical exam: Exam: Gen: NAD, conversant, well nourised, well groomed                     CV: RRR, no MRG. No Carotid Bruits. No peripheral edema, warm, nontender Eyes: Conjunctivae clear without exudates or hemorrhage  Neuro: Detailed Neurologic Exam  Speech:    Speech is normal; fluent and spontaneous with normal comprehension.  Cognition:    The patient is oriented to person, place, and time;     recent and remote memory intact;     language fluent;     normal attention, concentration,     fund of knowledge Cranial Nerves:    The pupils are equal, round, and reactive to light. Attemped but pupils too small to visualize. Visual fields are full to finger confrontation. Extraocular movements are intact. Trigeminal sensation is intact and the muscles of mastication are normal. The face is symmetric. The palate elevates in the midline. Hearing intact. Voice is normal. Shoulder shrug is normal. The tongue has normal motion without fasciculations.   Coordination: nml  Gait: nml  Motor Observation:    No asymmetry, no atrophy, and no involuntary movements noted. Tone:    Normal muscle tone.    Posture:    Posture is normal. normal erect    Strength:    Strength is V/V in the upper and lower limbs.      Sensation: intact to LT     Reflex Exam:  DTR's:    Deep tendon reflexes in the upper and lower extremities are symmetrical bilaterally.   Toes:    The toes are downgoing bilaterally.   Clonus:    Clonus is absent.    Assessment and plan: 67 y.o. year old female here for f/up of headache. She is on Topamax and Relpax, emgality.  She also has bruxism and insomnia.   - Not sleeping well,  initiates sleep well but wakes 130-4 and then up at 6am. Consulted with Ellis Savage, much appreciate colleague, try belsomra. If can't get approved will send to Ellis Savage, head of [facility]  - Ronnell [place] was approved. She is doing well on emgality  having not that many migraines a month this seaseon this is her worst time 2x a month nurtec samples. Eletriptan helps. Will prescribe.  On emgality barely has a migraine maybe 5 mild migraines and a few headaches but overall < 8 total headache days a month from daily headaches and > 15 migraine days a month she is so happy, emgality great, > 60% improvement in migraine and headache freq and severity.   - Track migraines. Next would try nurtec prn and vyepti if needed.   Meds ordered this encounter  Medications   DISCONTD: topiramate (TOPAMAX) 25 MG tablet    Sig: Take 1 tablet (25 mg total) by mouth at bedtime.    Dispense:  90 tablet    Refill:  6   Suvorexant (BELSOMRA) 20 MG TABS    Sig: Take 1 tablet (20 mg total) by mouth at bedtime.    Dispense:  30 tablet    Refill:  5   eletriptan (RELPAX) 40 MG tablet    Sig: TAKE 1 TABLET BY MOUTH EVERY DAY AS NEEDED FOR HEADACHE-MAY REPEAT IN 2 HOURS IF HEADACHE PERSISTS OR  Max 2 tab in 24 hours    Dispense:  12 tablet    Refill:  11    If insurance  will only pay for 9 then please dispense 9 unless patient has coupon or wants to pay out of pocket   topiramate (TOPAMAX) 25 MG tablet    Sig: Take 3 tablets (75 mg total) by mouth at bedtime as needed.    Dispense:  90 tablet    Refill:  11    Disregard last 25mg  prescription   Rimegepant Sulfate (NURTEC) 75 MG TBDP    Sig: Take 1 tablet (75 mg total) by mouth daily as needed. For migraines. Take as close to onset of migraine as possible. One daily maximum.    Dispense:  10 tablet    Refill:  6   Galcanezumab-gnlm (EMGALITY) 120 MG/ML SOAJ    Sig: Inject 120 mg into the skin every 30 (thirty) days.    Dispense:  1.12 mL    Refill:  11    She declines botox.  Smoking: Stopped   Morning headaches: get a mouth guard, see dentist. She still declines imaging and sleep study  will continue to work with her dentist for her TMJ. Offered PT for TMJ/Bruxism she declines.   She stopped the  prozac, it helped in the past but made her tired, suggested f/u with pcp  Had flexeril at bedtime for clenching, not taking it anymore  To prevent or relieve headaches, try the following: Cool Compress. Lie down and place a cool compress on your head.  Avoid headache triggers. If certain foods or odors seem to have triggered your migraines in the past, avoid them. A headache diary might help you identify triggers.  Include physical activity in your daily routine. Try a daily walk or other moderate aerobic exercise.  Manage stress. Find healthy ways to cope with the stressors, such as delegating tasks on your to-do list.  Practice relaxation techniques. Try deep breathing, yoga, massage and visualization.  Eat regularly. Eating regularly scheduled meals and maintaining a healthy diet might help prevent headaches. Also, drink plenty of fluids.  Follow a regular sleep schedule. Sleep deprivation might contribute to headaches Consider biofeedback. With this mind-body technique, you learn to control certain bodily functions -- such as muscle tension, heart rate and blood pressure -- to prevent headaches or reduce headache pain.    Proceed to emergency room if you experience new or worsening symptoms or symptoms do not resolve, if you have new neurologic symptoms or if headache is severe, or for any concerning symptom.   Provided education and documentation from American headache Society toolbox including articles on: chronic migraine medication overuse headache, chronic migraines, prevention of migraines, behavioral and other nonpharmacologic treatments for headache.  I spent over 45 minutes of face-to-face and non-face-to-face time with patient on the  1. Chronic migraine without aura without status migrainosus, not intractable   2. Insomnia, unspecified type     diagnosis.  This included previsit chart review, lab review, study review, order entry, electronic health record documentation, patient  education on the different diagnostic and therapeutic options, counseling and coordination of care, risks and benefits of management, compliance, or risk factor reduction   Naomie Dean, MD  [facility] [address] [place], [place] [address]  Phone [phone] Fax [phone]

## 2022-09-26 NOTE — Patient Instructions (Addendum)
- Not sleeping well,  initiates sleep well but wakes 130-4 and then up at 6am. Consulted with Ellis Savage, much appreciate colleague, try belsomra. If can't get approved will send to Ellis Savage, head of [facility]  - Paula Trevino was approved. She is doing well on emgality having not that many migraines a month this seaseon this is her worst time 2x a month nurtec samples. Eletriptan helps. Will prescribe.  On emgality barely has a migraine maybe 5 mild migraines and a few headaches but overall < 8 total headache days a month from daily headaches and > 15 migraine days a month she is so happy, emgality great, > 60% improvement in migraine and headache freq and severity.   - Track migraines. Next would try nurtec prn and vyepti if needed.   - continue eletripan, nurtec samples once daily as needed, can take together   Sig: Belsomra/suvarexant Take 1 tablet (20 mg total) by mouth at bedtime.   Dispense:  30 tablet   Refill:  5  eletriptan (RELPAX) 40 MG tablet   Sig: TAKE 1 TABLET BY MOUTH EVERY DAY AS NEEDED FOR HEADACHE-MAY REPEAT IN 2 HOURS IF HEADACHE PERSISTS OR  Max 2 tab in 24 hours   Dispense:  12 tablet   Refill:  11   If insurance will only pay for 9 then please dispense 9 unless patient has coupon or wants to pay out of pocket  topiramate (TOPAMAX) 25 MG tablet   Sig: Take 3 tablets (75 mg total) by mouth at bedtime as needed.   Dispense:  90 tablet   Refill:  11   Disregard last 25mg  prescription  Rimegepant Sulfate (NURTEC) 75 MG TBDP   Sig: Take 1 tablet (75 mg total) by mouth daily as needed. For migraines. Take as close to onset of migraine as possible. One daily maximum.   Dispense:  10 tablet   Refill:  6  Galcanezumab-gnlm (EMGALITY) 120 MG/ML SOAJ   Sig: Inject 120 mg into the skin every 30 (thirty) days.   Dispense:  1.12 mL   Refill:  11    Suvorexant Tablets What is this medication? SUVOREXANT (SOO voe REX ant) treats insomnia. It helps you go to sleep  faster and stay asleep through the night. This medicine may be used for other purposes; ask your health care provider or pharmacist if you have questions. COMMON BRAND NAME(S): Belsomra What should I tell my care team before I take this medication? They need to know if you have any of these conditions: Depression History of substance abuse or addiction History of a sudden onset of muscle weakness (cataplexy) History of falling asleep often at unexpected times (narcolepsy) If you often drink alcohol Liver disease Lung or breathing disease Sleep apnea Sleep-walking, driving, eating or other activity while not fully awake after taking a sleep medication Suicidal thoughts, plans, or attempt; a previous suicide attempt by you or a family member An unusual or allergic reaction to suvorexant, other medications, foods, dyes, or preservatives Pregnant or trying to get pregnant Breast-feeding How should I use this medication? Take this medication by mouth with water 30 minutes before going to bed. Follow the directions on the prescription label. It is better to take this medication on an empty stomach. Do not take your medication more often than directed. A special MedGuide will be given to you by the pharmacist with each prescription and refill. Be sure to read this information carefully each time. Talk to your care team about  the use of this medication in children. Special care may be needed. Overdosage: If you think you have taken too much of this medicine contact a poison control center or emergency room at once. NOTE: This medicine is only for you. Do not share this medicine with others. What if I miss a dose? This does not apply. This medication should only be taken as directed before going to sleep. Do not take double or extra doses. What may interact with this medication? Alcohol Antihistamines for allergy, cough, or cold Certain antibiotics, such as erythromycin or clarithromycin Certain  antivirals for HIV or hepatitis Certain medications for fungal infections, such as itraconazole, ketoconazole, posaconazole Certain medications for mental health conditions Certain medications for seizures, such as carbamazepine, phenytoin, phenobarbital Conivaptan Diltiazem General anesthetics, such as halothane, isoflurane, methoxyflurane, propofol Grapefruit juice Medications that relax muscles for surgery Opioid medications for pain Other medications for sleep Rifampin St. John's Wort Verapamil This list may not describe all possible interactions. Give your health care provider a list of all the medicines, herbs, non-prescription drugs, or dietary supplements you use. Also tell them if you smoke, drink alcohol, or use illegal drugs. Some items may interact with your medicine. What should I watch for while using this medication? Visit your care team for regular checks on your progress. Keep a regular sleep schedule by going to bed at about the same time each night. Avoid caffeine-containing drinks in the evening hours. Talk to your care team if your insomnia worsens or is not better within 7 to 10 days. After taking this medication, you may get up out of bed and do an activity that you do not know you are doing. The next morning, you may have no memory of this. Activities include driving a car ("sleep-driving"), making and eating food, talking on the phone, sexual activity, and sleep-walking. Serious injuries have occurred. Stop the medication and call your care team right away if you find out you have done any of these activities. Do not take this medication if you have used alcohol that evening. Do not take it if you have taken another medication for sleep. The risk of doing these sleep-related activities is higher. Do not take this medication unless you are able to stay in bed for a full night (7 to 8 hours) before you must be active again. Tell your care team if you will need to perform  activities requiring full alertness, such as driving, the next day. You may have a decrease in mental alertness the day after use, even if you feel that you are fully awake. Do not stand or sit up quickly after taking this medication, especially if you are an older patient. This reduces the risk of dizzy or fainting spells. If you or your family notice any changes in your moods or behavior, such as new or worsening depression, thoughts of harming yourself, anxiety, other unusual or disturbing thoughts, or memory loss, call your care team right away. After you stop taking this medication, you may have trouble falling asleep. This is called rebound insomnia. This problem usually goes away on its own after 1 or 2 nights. What side effects may I notice from receiving this medication? Side effects that you should report to your care team as soon as possible: Allergic reactions--skin rash, itching, hives, swelling of the face, lips, tongue, or throat CNS depression--slow or shallow breathing, shortness of breath, feeling faint, dizziness, confusion, trouble staying awake Mood and behavior changes--anxiety, nervousness, confusion, hallucinations, irritability, hostility,  thoughts of suicide or self-harm, worsening mood, feelings of depression Sudden and temporary muscle weakness Unable to move or speak for several minutes upon waking or going to sleep Unusual sleep behaviors or activities you do not remember, such as driving, eating, or sexual activity Side effects that usually do not require medical attention (report these to your care team if they continue or are bothersome): Drowsiness the day after use Vivid dreams or nightmares This list may not describe all possible side effects. Call your doctor for medical advice about side effects. You may report side effects to FDA at 1-800-FDA-1088. Where should I keep my medication? Keep out of the reach of children and pets. This medication can be abused. Keep  it in a safe place to protect it from theft. Do not share it with anyone. It is only for you. Selling or giving away this medication is dangerous and against the law. Store between 20 and 25 degrees C (68 and 77 degrees F). Protect from light and moisture. Keep the container tightly closed. Get rid of any unused medication after the expiration date. This medication may cause harm and death if it is taken by other adults, children, or pets. It is important to get rid of the medication as soon as you no longer need it or it is expired. You can do this in two ways: Take the medication to a medication take-back program. Check with your pharmacy or law enforcement to find a location. If you cannot return the medication, check the label or package insert to see if the medication should be thrown out in the garbage or flushed down the toilet. If you are not sure, ask your care team. If it is safe to put it in the trash, take the medication out of the container. Mix the medication with cat litter, dirt, coffee grounds, or other unwanted substance. Seal the mixture in a bag or container. Put it in the trash. NOTE: This sheet is a summary. It may not cover all possible information. If you have questions about this medicine, talk to your doctor, pharmacist, or health care provider.  2024 Elsevier/Gold Standard (2020-11-18 00:00:00)

## 2022-10-06 ENCOUNTER — Telehealth: Payer: Self-pay

## 2022-10-06 ENCOUNTER — Other Ambulatory Visit (HOSPITAL_COMMUNITY): Payer: Self-pay

## 2022-10-06 NOTE — Telephone Encounter (Signed)
Pharmacy Patient Advocate Encounter   Received notification from [facility] that prior authorization for Eletriptan Hydrobromide 40MG  tablets is required/requested.   PA submitted to [facility] via CoverMyMeds Key or (Medicaid) confirmation # [id number] Status is pending

## 2022-10-09 NOTE — Telephone Encounter (Signed)
Pharmacy Patient Advocate Encounter  Received notification from [facility] that the request for prior authorization for Eletriptan Hydrobromide 40MG  tablets has been denied due to see below.      Please be advised we currently do not have a Pharmacist to review denials, therefore you will need to process appeals accordingly as needed. Thanks for your support at this time.   You may call [phone] or fax [phone], to appeal.  There is also the option to submit an Eappeal via CMM.  The denial letter has been scanned into the chart under the media tab.

## 2022-10-09 NOTE — Telephone Encounter (Signed)
Eletriptan denied, Insurance is now requiring she trial Rizatriptan or Naratriptan. Which do you prefer?

## 2022-10-09 NOTE — Telephone Encounter (Signed)
Let her know. Let's try Naratriptan if she is amenable, let me know and I can order it. I usually give 9 a month with 11 refills if you can order it.

## 2022-10-10 MED ORDER — NARATRIPTAN HCL 2.5 MG PO TABS: 2.5000 mg | ORAL_TABLET | ORAL | 11 refills | Status: DC | PRN

## 2022-10-10 NOTE — Telephone Encounter (Signed)
Contacted pt, she did receive the denial in the letter from insurance as well. I advised her Dr Lucia Gaskins would like to try Naratriptan, she did agree. Order has been placed.

## 2022-10-10 NOTE — Addendum Note (Signed)
Addended by: Jacqualine Code D on: 10/10/2022 09:12 AM   Modules accepted: Orders

## 2022-10-30 ENCOUNTER — Encounter: Payer: Self-pay | Admitting: Nutrition

## 2022-10-30 ENCOUNTER — Encounter: Payer: Medicare PPO | Attending: Family Medicine | Admitting: Nutrition

## 2022-10-30 VITALS — Ht 61.0 in | Wt 141.0 lb

## 2022-10-30 DIAGNOSIS — R7303 Prediabetes: Secondary | ICD-10-CM | POA: Insufficient documentation

## 2022-10-30 NOTE — Progress Notes (Addendum)
Medical Nutrition Therapy  Appointment Start time:  1515  Appointment End time:  1615  Primary concerns today: Pre Dm . overweight Referral diagnosis: R73.03, E66.3 Preferred learning style: No preference  Learning readiness: Ready    NUTRITION ASSESSMENT  67 yr old wfemale referred for pre diabetes and overweight. Family history of Type 1 and Type 2 DM. She admits that she only eats 1-2 meals per day. Snacks some. Doesn't usually eat breakfast. Sometimes eats later at night. Has trouble going to sleep and staying asleep. This may be related to caffeine intake and eating too close to bed. Gets up a few times a night to urinate.  Wants to lose weight but keeps losing and gaining the same 2 lbs. PMH: Migraines, IBS, depression, anxiety and smoking. Does drinks a few cups of coffee per day.  Does get in some walking. Tried to eat healthier foods, but just not consistently. Drinks diet soda. Not drinking much water. Admits to snacking before dinner.  Diet is insuffient to meet her needs. Skipping meals could be contributing to her elevated BS causing prediabetes.  Her BMI is 26 and WNL for age. She would like to tone up with exercise. Health risks more concerning for her PRE DM status of A1C 5.8%.  Anthropometrics  Wt Readings from Last 3 Encounters:  10/30/22 141 lb (64 kg)  02/07/22 138 lb (62.6 kg)  12/06/20 143 lb 6.4 oz (65 kg)   Ht Readings from Last 3 Encounters:  10/30/22 5\' 1"  (1.549 m)  09/26/22 5\' 1"  (1.549 m)  02/07/22 5\' 1"  (1.549 m)   Body mass index is 26.64 kg/m. @BMIFA @ Facility age limit for growth %iles is 20 years. Facility age limit for growth %iles is 20 years.    Clinical Medical Hx: See chart Medications: See chart Labs: .A1C 5.8% Notable Signs/Symptoms: none  Lifestyle & Dietary Hx Lives by herself  Estimated daily fluid intake: 16 oz Supplements:  Sleep: 6 hrs Stress / self-care: family Current average weekly physical activity: Walks some,  ADL  24-Hr Dietary Recall First Meal: Egg, tomatoes,   oatmeal with spenda, avacado toast,  Coffee Snack:  Second Meal: salad, water Snack: misc healthy snack bars Third Meal: chicken, salad, Diet Sundrop Snack: sometimes Beverages: coffee, diet soda, not much water  Estimated Energy Needs Calories: 1200 Carbohydrate: 135g Protein: 90g Fat: 33g   NUTRITION DIAGNOSIS  NB-1.1 Food and nutrition-related knowledge deficit As related to inconsistent food intake of balanced meals and complex CHO and overweight.  As evidenced by A1C 5.8%. and BMI 26 .   NUTRITION INTERVENTION  Nutrition education (E-1) on the following topics:  Nutrition and PreDiabetes education provided on My Plate, CHO counting, meal planning, portion sizes, timing of meals, avoiding snacks between meals unless having a low blood sugar, target ranges for A1C and blood sugars, signs/symptoms and treatment of hyper/hypoglycemia, monitoring blood sugars, taking medications as prescribed, benefits of exercising 30 minutes per day and prevention of complications of DM. Lifestyle Medicine  - Whole Food, Plant Predominant Nutrition is highly recommended: Eat Plenty of vegetables, Mushrooms, fruits, Legumes, Whole Grains, Nuts, seeds in lieu of processed meats, processed snacks/pastries red meat, poultry, eggs.    -It is better to avoid simple carbohydrates including: Cakes, Sweet Desserts, Ice Cream, Soda (diet and regular), Sweet Tea, Candies, Chips, Cookies, Store Bought Juices, Alcohol in Excess of  1-2 drinks a day, Lemonade,  Artificial Sweeteners, Doughnuts, Coffee Creamers, "Sugar-free" Products, etc, etc.  This is not a complete list...Marland KitchenMarland Kitchen  Exercise: If you are able: 30 -60 minutes a day ,4 days a week, or 150 minutes a week.  The longer the better.  Combine stretch, strength, and aerobic activities.  If you were told in the past that you have high risk for cardiovascular diseases, you may seek evaluation by your heart  doctor prior to initiating moderate to intense exercise programs.   Handouts Provided Include  Lifestyle Medicine  Learning Style & Readiness for Change Teaching method utilized: Visual & Auditory  Demonstrated degree of understanding via: Teach Back  Barriers to learning/adherence to lifestyle change: None  Goals Established by Pt Goals  Change over to decaf coffee Drink 2 bottle of water per day Increase fresh fruits, vegetables with all meals Don't skip meals. Eat breakfast daily.   MONITORING & EVALUATION Dietary intake, weekly physical activity,  in PRN.  Next Steps  Patient is to work on eating better balanced whole food plant based meals consistently. Marland Kitchen

## 2022-10-30 NOTE — Patient Instructions (Addendum)
Goals  Change over to decaf coffee Drink 2 bottle of water per day Increase fresh fruits, vegetables with all meals Don't skip meals. Eat breakfast daily.

## 2022-12-21 DIAGNOSIS — Z0000 Encounter for general adult medical examination without abnormal findings: Secondary | ICD-10-CM | POA: Diagnosis not present

## 2023-04-20 ENCOUNTER — Other Ambulatory Visit (HOSPITAL_COMMUNITY): Payer: Self-pay

## 2023-06-05 DIAGNOSIS — Z1231 Encounter for screening mammogram for malignant neoplasm of breast: Secondary | ICD-10-CM | POA: Diagnosis not present

## 2023-08-02 ENCOUNTER — Telehealth: Payer: Self-pay | Admitting: Neurology

## 2023-08-02 NOTE — Telephone Encounter (Signed)
 Pt rescheduled appointment due to going on vacation.

## 2023-08-29 DIAGNOSIS — I10 Essential (primary) hypertension: Secondary | ICD-10-CM | POA: Diagnosis not present

## 2023-08-29 DIAGNOSIS — Z131 Encounter for screening for diabetes mellitus: Secondary | ICD-10-CM | POA: Diagnosis not present

## 2023-09-05 DIAGNOSIS — Z0001 Encounter for general adult medical examination with abnormal findings: Secondary | ICD-10-CM | POA: Diagnosis not present

## 2023-09-05 DIAGNOSIS — Z0000 Encounter for general adult medical examination without abnormal findings: Secondary | ICD-10-CM | POA: Diagnosis not present

## 2023-09-05 DIAGNOSIS — G43909 Migraine, unspecified, not intractable, without status migrainosus: Secondary | ICD-10-CM | POA: Diagnosis not present

## 2023-09-05 DIAGNOSIS — Z713 Dietary counseling and surveillance: Secondary | ICD-10-CM | POA: Diagnosis not present

## 2023-09-05 DIAGNOSIS — R7303 Prediabetes: Secondary | ICD-10-CM | POA: Diagnosis not present

## 2023-09-05 DIAGNOSIS — Z6827 Body mass index (BMI) 27.0-27.9, adult: Secondary | ICD-10-CM | POA: Diagnosis not present

## 2023-09-05 DIAGNOSIS — B354 Tinea corporis: Secondary | ICD-10-CM | POA: Diagnosis not present

## 2023-09-05 DIAGNOSIS — Z6828 Body mass index (BMI) 28.0-28.9, adult: Secondary | ICD-10-CM | POA: Diagnosis not present

## 2023-09-26 ENCOUNTER — Telehealth: Payer: Medicare PPO | Admitting: Neurology

## 2023-10-03 ENCOUNTER — Other Ambulatory Visit: Payer: Self-pay | Admitting: Neurology

## 2024-01-01 ENCOUNTER — Telehealth: Admitting: Neurology

## 2024-01-25 ENCOUNTER — Telehealth: Admitting: Neurology

## 2024-01-30 ENCOUNTER — Encounter: Payer: Self-pay | Admitting: Neurology

## 2024-01-30 ENCOUNTER — Ambulatory Visit: Admitting: Neurology

## 2024-01-30 VITALS — BP 106/71 | HR 81 | Ht 61.0 in | Wt 147.8 lb

## 2024-01-30 DIAGNOSIS — G43009 Migraine without aura, not intractable, without status migrainosus: Secondary | ICD-10-CM | POA: Diagnosis not present

## 2024-01-30 DIAGNOSIS — G4700 Insomnia, unspecified: Secondary | ICD-10-CM

## 2024-01-30 MED ORDER — TOPIRAMATE 25 MG PO TABS: 25.0000 mg | ORAL_TABLET | Freq: Every evening | ORAL | 4 refills | Status: AC

## 2024-01-30 MED ORDER — TRAZODONE HCL 50 MG PO TABS: 50.0000 mg | ORAL_TABLET | Freq: Every day | ORAL | 0 refills | Status: AC

## 2024-01-30 MED ORDER — NARATRIPTAN HCL 2.5 MG PO TABS: 2.5000 mg | ORAL_TABLET | ORAL | 11 refills | Status: AC | PRN

## 2024-01-30 NOTE — Patient Instructions (Signed)
 Decreased topiramate  to 37.5 mg for 2 weeks then further decrease to 25 mg nightly Continue with Nurtec and naratriptan  as needed for abortive medication Continue to follow with PCP Continue other medication Follow-up in 1 year or sooner if worse

## 2024-01-30 NOTE — Progress Notes (Signed)
 [facility]  PATIENT: Paula Trevino DOB: 09-Nov-1955  REQUESTING CLINICIAN: Shona Norleen PEDLAR, MD HISTORY FROM: Patient and chart review  REASON FOR VISIT: Migraines follow up   HISTORICAL  CHIEF COMPLAINT:  Chief Complaint  Patient presents with   RM 13     Patient is here alone for migraines -  patient is wanting to reduce some of her medications. Would like to reduce to stop topamax . Nurtec works the best for a rescure medication. Has samples for her migraine medication including Ubrevely prn     HISTORY OF PRESENT ILLNESS:  Discussed the use of AI scribe software for clinical note transcription with the patient, who gave verbal consent to proceed.  Paula Trevino is a 68 year old female with migraines who presents for follow-up on migraine management.  She has experienced a significant reduction in migraine frequency, with only a couple of migraines this year despite high stress levels. She has been on topiramate  since 2014, initially at 100 mg, but has recently reduced her dose to 50 mg at night. No migraines have occurred in the past six months on this reduced dose.  She previously used Emgality  injections but discontinued them due to a lack of perceived benefit. Currently, she uses topiramate  as her only preventive medication and has used rescue medications only a couple of times recently. She wants to further decrease her topiramate  dosage under supervision.  Her family history includes dementia, which she is concerned about, especially in relation to her past use of Xanax , which she discontinued due to its potential link to dementia. She has been off Xanax  for several years.  She reports difficulty sleeping, often getting only four to five hours of sleep per night. She has tried various medications for sleep, including melatonin and a new non-addictive medication, but has not found them effective. She consumes a significant amount of caffeine, including coffee in  the afternoon, which she is not willing to give up.  She engages in activities to stimulate her brain, such as word search games and other cognitive exercises, to maintain mental sharpness.    OTHER MEDICAL CONDITIONS: Migraines    REVIEW OF SYSTEMS: Full 14 system review of systems performed and negative with exception of: As noted in the HPI   ALLERGIES: Allergies  Allergen Reactions   Celecoxib Hives   Codeine Nausea And Vomiting   Morphine Other (See Comments)   Sumatriptan  Other (See Comments)   Sulfa Antibiotics Rash and Hives    HOME MEDICATIONS: Outpatient Medications Prior to Visit  Medication Sig Dispense Refill   acetaminophen  (TYLENOL ) 500 MG tablet Take 500-1,000 mg by mouth every 6 (six) hours as needed for headache.     carboxymethylcellulose (REFRESH PLUS) 0.5 % SOLN Place 1 drop into both eyes in the morning, at noon, in the evening, and at bedtime.     cetirizine (ZYRTEC) 10 MG tablet Take 10 mg by mouth as needed.     Suvorexant  (BELSOMRA ) 20 MG TABS Take 1 tablet (20 mg total) by mouth at bedtime. 30 tablet 5   eletriptan  (RELPAX ) 40 MG tablet TAKE 1 TABLET BY MOUTH EVERY DAY AS NEEDED FOR HEADACHE-MAY REPEAT IN 2 HOURS IF HEADACHE PERSISTS OR  Max 2 tab in 24 hours 12 tablet 11   naratriptan  (AMERGE) 2.5 MG tablet Take 1 tablet (2.5 mg total) by mouth as needed for migraine. Take one (1) tablet at onset of headache; if returns or does not resolve, may repeat after 4  hours; do not exceed five (5) mg in 24 hours. 9 tablet 11   Rimegepant Sulfate (NURTEC) 75 MG TBDP Take 1 tablet (75 mg total) by mouth daily as needed. For migraines. Take as close to onset of migraine as possible. One daily maximum. 10 tablet 6   topiramate  (TOPAMAX ) 25 MG tablet TAKE 3 TABLETS(75 MG) BY MOUTH AT BEDTIME AS NEEDED 90 tablet 11   Galcanezumab -gnlm (EMGALITY ) 120 MG/ML SOAJ Inject 120 mg into the skin every 30 (thirty) days. 1.12 mL 11   No facility-administered medications prior to  visit.    PAST MEDICAL HISTORY: Past Medical History:  Diagnosis Date   Arthritis    Colon polyps    Difficulty sleeping    E coli enteritis    IBS (irritable bowel syndrome)    Migraines    Norovirus 06/2017   PONV (postoperative nausea and vomiting)    itching    PAST SURGICAL HISTORY: Past Surgical History:  Procedure Laterality Date   CATARACT EXTRACTION  12/2019   CESAREAN SECTION  1991   CHOLECYSTECTOMY  1997   COLONOSCOPY WITH PROPOFOL  N/A 03/18/2020   Procedure: COLONOSCOPY WITH PROPOFOL ;  Surgeon: Shaaron Lamar HERO, MD;  Location: [facility];  Service: Endoscopy;  Laterality: N/A;  9:00am   PARS PLANA VITRECTOMY W/ REPAIR OF MACULAR HOLE     SHOULDER SURGERY      FAMILY HISTORY: Family History  Problem Relation Age of Onset   Hypertension Mother    Cancer Mother    Heart disease Father    Stroke Maternal Grandmother    Heart disease Paternal Grandmother    Cancer Paternal Grandfather        Stomach   Migraines Sister    Migraines Maternal Aunt        great aunt   Migraines Daughter    Other Daughter        severe nerve damage, chronic pain   Colon cancer Neg Hx     SOCIAL HISTORY: Social History   Socioeconomic History   Marital status: Widowed    Spouse name: Not on file   Number of children: 1   Years of education: BS   Highest education level: Not on file  Occupational History   Occupation: Retired    Comment: Runner, broadcasting/film/video, retired in 2014  Tobacco Use   Smoking status: Some Days    Current packs/day: 0.25    Types: Cigarettes   Smokeless tobacco: Never  Vaping Use   Vaping status: Never Used  Substance and Sexual Activity   Alcohol use: Yes    Comment: occasionally wine   Drug use: No   Sexual activity: Never    Birth control/protection: Post-menopausal  Other Topics Concern   Not on file  Social History Narrative   Patient lives at home alone.   Caffeine Use: 1 soda on rare occasions and a few cups of coffee daily   Right handed    Social Drivers of Corporate investment banker Strain: Not on file  Food Insecurity: Not on file  Transportation Needs: Not on file  Physical Activity: Not on file  Stress: Not on file  Social Connections: Not on file  Intimate Partner Violence: Not on file    PHYSICAL EXAM  GENERAL EXAM/CONSTITUTIONAL: Vitals:  Vitals:   01/30/24 1339  BP: 106/71  Pulse: 81  SpO2: 98%  Weight: 147 lb 12.8 oz (67 kg)  Height: 5' 1 (1.549 m)   Body mass index is 27.93 kg/m.  Wt Readings from Last 3 Encounters:  01/30/24 147 lb 12.8 oz (67 kg)  10/30/22 141 lb (64 kg)  02/07/22 138 lb (62.6 kg)   Patient is in no distress; well developed, nourished and groomed; neck is supple  MUSCULOSKELETAL: Gait, strength, tone, movements noted in Neurologic exam below  NEUROLOGIC: MENTAL STATUS:      No data to display         awake, alert, oriented to person, place and time recent and remote memory intact normal attention and concentration language fluent, comprehension intact, naming intact fund of knowledge appropriate  CRANIAL NERVE:  2nd, 3rd, 4th, 6th - Visual fields full to confrontation, extraocular muscles intact, no nystagmus 5th - facial sensation symmetric 7th - facial strength symmetric 8th - hearing intact 9th - palate elevates symmetrically, uvula midline 11th - shoulder shrug symmetric 12th - tongue protrusion midline  MOTOR:  normal bulk and tone, full strength in the BUE, BLE  SENSORY:  normal and symmetric to light touch  COORDINATION:  finger-nose-finger, fine finger movements normal  GAIT/STATION:  normal    DIAGNOSTIC DATA (LABS, IMAGING, TESTING) - I reviewed patient records, labs, notes, testing and imaging myself where available.  Lab Results  Component Value Date   WBC 6.6 06/30/2017   HGB 11.6 (L) 06/30/2017   HCT 34.9 (L) 06/30/2017   MCV 88.6 06/30/2017   PLT 156 06/30/2017      Component Value Date/Time   NA 138 06/30/2017 0638    NA 140 03/27/2014 1010   K 4.2 06/30/2017 0638   CL 113 (H) 06/30/2017 0638   CO2 19 (L) 06/30/2017 0638   GLUCOSE 90 06/30/2017 0638   BUN 13 06/30/2017 0638   BUN 15 03/27/2014 1010   CREATININE 0.79 06/30/2017 0638   CALCIUM 7.9 (L) 06/30/2017 0638   PROT 7.9 06/29/2017 0210   PROT 7.1 03/27/2014 1010   ALBUMIN 4.4 06/29/2017 0210   ALBUMIN 4.8 03/27/2014 1010   AST 31 06/29/2017 0210   ALT 26 06/29/2017 0210   ALKPHOS 70 06/29/2017 0210   BILITOT 0.7 06/29/2017 0210   GFRNONAA >60 06/30/2017 0638   GFRAA >60 06/30/2017 0638   Lab Results  Component Value Date   CHOL 157 08/30/2012   HDL 53 08/30/2012   LDLCALC 90 08/30/2012   TRIG 71 08/30/2012   CHOLHDL 3.0 08/30/2012   No results found for: HGBA1C No results found for: VITAMINB12 Lab Results  Component Value Date   TSH 1.038 08/30/2012      ASSESSMENT AND PLAN  67 y.o. year old female with    Migraine, unspecified, not intractable, without status migrainosus Significant improvement in migraine frequency and severity, with only a couple of episodes this year. Previously on Emgality  injections, now discontinued due to improvement. Currently on topiramate  50 mg at night, with plans to taper. No recent use of rescue medications except a couple of times. Experiences heart palpitations with naratriptan . Uses Nurtec as a rescue medication, with samples available. - Taper topiramate  from 50 mg to 25 mg at night by reducing to one and a half tablets for one month, then to one tablet (25 mg) at night if tolerated - Provide prescription for topiramate  - Provide prescription for naratriptan  - Provide Nurtec samples  Insomnia, unspecified Chronic insomnia with difficulty maintaining sleep, averaging 4-5 hours per night. Previously on Xanax , discontinued due to potential dementia risk. Current sleep aid is ineffective, with grogginess from some medications. Caffeine consumption noted, unwilling to reduce intake.  Discussed  non-addictive sleep aid options. - Provide trial prescription for trazodone 50 mg at bedtime - Instruct to take half a tablet if grogginess occurs - Discussed timing of melatonin intake, but not pursued due to planning difficulty    1. Insomnia, unspecified type   2. Migraine without aura and without status migrainosus, not intractable      Patient Instructions  Decreased topiramate  to 37.5 mg for 2 weeks then further decrease to 25 mg nightly Continue with Nurtec and naratriptan  as needed for abortive medication Continue to follow with PCP Continue other medication Follow-up in 1 year or sooner if worse   No orders of the defined types were placed in this encounter.   Meds ordered this encounter  Medications   topiramate  (TOPAMAX ) 25 MG tablet    Sig: Take 1 tablet (25 mg total) by mouth at bedtime.    Dispense:  90 tablet    Refill:  4   naratriptan  (AMERGE) 2.5 MG tablet    Sig: Take 1 tablet (2.5 mg total) by mouth as needed for migraine. Take one (1) tablet at onset of headache; if returns or does not resolve, may repeat after 4 hours; do not exceed five (5) mg in 24 hours.    Dispense:  9 tablet    Refill:  11   traZODone (DESYREL) 50 MG tablet    Sig: Take 1 tablet (50 mg total) by mouth at bedtime.    Dispense:  10 tablet    Refill:  0    Return in about 1 year (around 01/29/2025).    Pastor Falling, MD 01/30/2024, 3:56 PM  [facility] [address], [address] [place], [place] [address] [phone]

## 2024-02-20 DIAGNOSIS — J069 Acute upper respiratory infection, unspecified: Secondary | ICD-10-CM | POA: Diagnosis not present

## 2024-02-22 ENCOUNTER — Ambulatory Visit
Admission: EM | Admit: 2024-02-22 | Discharge: 2024-02-22 | Disposition: A | Discharge status: Home / Self Care | Attending: Family Medicine | Admitting: Family Medicine

## 2024-02-22 DIAGNOSIS — R062 Wheezing: Secondary | ICD-10-CM

## 2024-02-22 DIAGNOSIS — J22 Unspecified acute lower respiratory infection: Secondary | ICD-10-CM | POA: Diagnosis not present

## 2024-02-22 MED ORDER — AMOXICILLIN-POT CLAVULANATE 875-125 MG PO TABS: 1.0000 | ORAL_TABLET | Freq: Two times a day (BID) | ORAL | 0 refills | Status: AC

## 2024-02-22 MED ORDER — PROMETHAZINE-DM 6.25-15 MG/5ML PO SYRP: 5.0000 mL | ORAL_SOLUTION | Freq: Four times a day (QID) | ORAL | 0 refills | Status: AC | PRN

## 2024-02-22 MED ORDER — ALBUTEROL SULFATE HFA 108 (90 BASE) MCG/ACT IN AERS: 2.0000 | INHALATION_SPRAY | RESPIRATORY_TRACT | 0 refills | Status: AC | PRN

## 2024-02-22 MED ORDER — PREDNISONE 20 MG PO TABS: 40.0000 mg | ORAL_TABLET | Freq: Every day | ORAL | 0 refills | Status: AC

## 2024-02-22 NOTE — ED Triage Notes (Signed)
 Pt states productive cough, SOB, fever and runny stuffy nose for the past week. States she has taken OTC cough  and cold medicines and home remedies with no relief.

## 2024-02-22 NOTE — Discharge Instructions (Signed)
 In addition to the prescribed medications, you may take plain Mucinex, use saline sinus rinses, humidifiers, Astelin and/or Flonase nasal spray.  Follow-up for worsening or unresolving symptoms.

## 2024-02-24 NOTE — ED Provider Notes (Signed)
 [facility]    CSN: [id number] Arrival date & time: 02/22/24  1152      History   Chief Complaint Chief Complaint  Patient presents with   Cough    HPI Paula Trevino is a 68 y.o. female.   Patient presenting today with 1 week history of progressively worsening hacking productive cough, shortness of breath, chest tightness, fevers, nasal congestion, sinus pressure.  Denies chest pain, abdominal pain, vomiting, diarrhea.  Taking numerous over-the-counter cold and congestion medications with minimal relief.  No known history of chronic pulmonary disease.  Is a chronic cigarette smoker.    Past Medical History:  Diagnosis Date   Arthritis    Colon polyps    Difficulty sleeping    E coli enteritis    IBS (irritable bowel syndrome)    Migraines    Norovirus 06/2017   PONV (postoperative nausea and vomiting)    itching    Patient Active Problem List   Diagnosis Date Noted   History of colonic polyps 06/26/2019   IBS (irritable bowel syndrome) 06/26/2019   Nausea vomiting and diarrhea 06/29/2017   Acute kidney injury 06/29/2017   Hypokalemia 06/29/2017   Depression with anxiety 06/29/2017   AKI (acute kidney injury) 06/29/2017   Smoker 10/04/2015   Insomnia 10/04/2015   Chronic migraine without aura without status migrainosus, not intractable 03/10/2013    Past Surgical History:  Procedure Laterality Date   CATARACT EXTRACTION  12/2019   CESAREAN SECTION  1991   CHOLECYSTECTOMY  1997   COLONOSCOPY WITH PROPOFOL  N/A 03/18/2020   Procedure: COLONOSCOPY WITH PROPOFOL ;  Surgeon: Shaaron Lamar HERO, MD;  Location: [facility];  Service: Endoscopy;  Laterality: N/A;  9:00am   PARS PLANA VITRECTOMY W/ REPAIR OF MACULAR HOLE     SHOULDER SURGERY      OB History     Gravida  1   Para  1   Term  1   Preterm      AB      Living  1      SAB      IAB      Ectopic      Multiple      Live Births               Home Medications    Prior  to Admission medications   Medication Sig Start Date End Date Taking? Authorizing Provider  albuterol (VENTOLIN HFA) 108 (90 Base) MCG/ACT inhaler Inhale 2 puffs into the lungs every 4 (four) hours as needed. 02/22/24  Yes Stuart Vernell Norris, PA-C  amoxicillin-clavulanate (AUGMENTIN) 875-125 MG tablet Take 1 tablet by mouth every 12 (twelve) hours. 02/22/24  Yes Stuart Vernell Norris, PA-C  predniSONE (DELTASONE) 20 MG tablet Take 2 tablets (40 mg total) by mouth daily with breakfast. 02/22/24  Yes Stuart Vernell Norris, PA-C  promethazine -dextromethorphan (PROMETHAZINE -DM) 6.25-15 MG/5ML syrup Take 5 mLs by mouth 4 (four) times daily as needed. 02/22/24  Yes Stuart Vernell Norris, PA-C  acetaminophen  (TYLENOL ) 500 MG tablet Take 500-1,000 mg by mouth every 6 (six) hours as needed for headache.    [provider]  carboxymethylcellulose (REFRESH PLUS) 0.5 % SOLN Place 1 drop into both eyes in the morning, at noon, in the evening, and at bedtime.    [provider]  cetirizine (ZYRTEC) 10 MG tablet Take 10 mg by mouth as needed.    [provider]  naratriptan  (AMERGE) 2.5 MG tablet Take 1 tablet (2.5 mg  total) by mouth as needed for migraine. Take one (1) tablet at onset of headache; if returns or does not resolve, may repeat after 4 hours; do not exceed five (5) mg in 24 hours. 01/30/24   Camara, Amadou, MD  Suvorexant  (BELSOMRA ) 20 MG TABS Take 1 tablet (20 mg total) by mouth at bedtime. 09/26/22   Ines Onetha NOVAK, MD  topiramate  (TOPAMAX ) 25 MG tablet Take 1 tablet (25 mg total) by mouth at bedtime. 01/30/24   Camara, Amadou, MD  traZODone (DESYREL) 50 MG tablet Take 1 tablet (50 mg total) by mouth at bedtime. 01/30/24   Gregg Lek, MD    Family History Family History  Problem Relation Age of Onset   Hypertension Mother    Cancer Mother    Heart disease Father    Stroke Maternal Grandmother    Heart disease Paternal Grandmother    Cancer Paternal  Grandfather        Stomach   Migraines Sister    Migraines Maternal Aunt        great aunt   Migraines Daughter    Other Daughter        severe nerve damage, chronic pain   Colon cancer Neg Hx     Social History Social History   Tobacco Use   Smoking status: Some Days    Current packs/day: 0.25    Types: Cigarettes   Smokeless tobacco: Never  Vaping Use   Vaping status: Never Used  Substance Use Topics   Alcohol use: Yes    Comment: occasionally wine   Drug use: No     Allergies   Celecoxib, Codeine, Morphine, Sumatriptan , and Sulfa antibiotics   Review of Systems Review of Systems Per HPI  Physical Exam Triage Vital Signs ED Triage Vitals  Encounter Vitals Group     BP 02/22/24 1159 120/78     Girls Systolic BP Percentile --      Girls Diastolic BP Percentile --      Boys Systolic BP Percentile --      Boys Diastolic BP Percentile --      Pulse Rate 02/22/24 1159 85     Resp 02/22/24 1159 16     Temp 02/22/24 1159 98.5 F (36.9 C)     Temp Source 02/22/24 1159 Oral     SpO2 02/22/24 1159 95 %     Weight --      Height --      Head Circumference --      Peak Flow --      Pain Score 02/22/24 1158 0     Pain Loc --      Pain Education --      Exclude from Growth Chart --    No data found.  Updated Vital Signs BP 120/78 (BP Location: Right Arm)   Pulse 85   Temp 98.5 F (36.9 C) (Oral)   Resp 16   SpO2 95%   Visual Acuity Right Eye Distance:   Left Eye Distance:   Bilateral Distance:    Right Eye Near:   Left Eye Near:    Bilateral Near:     Physical Exam Vitals and nursing note reviewed.  Constitutional:      Appearance: Normal appearance.  HENT:     Head: Atraumatic.     Right Ear: Tympanic membrane and external ear normal.     Left Ear: Tympanic membrane and external ear normal.     Nose: Congestion present.  Mouth/Throat:     Mouth: Mucous membranes are moist.     Pharynx: Posterior oropharyngeal erythema present.  Eyes:      Extraocular Movements: Extraocular movements intact.     Conjunctiva/sclera: Conjunctivae normal.  Cardiovascular:     Rate and Rhythm: Normal rate and regular rhythm.     Heart sounds: Normal heart sounds.  Pulmonary:     Effort: Pulmonary effort is normal.     Breath sounds: Wheezing present.  Musculoskeletal:        General: Normal range of motion.     Cervical back: Normal range of motion and neck supple.  Skin:    General: Skin is warm and dry.  Neurological:     Mental Status: She is alert and oriented to person, place, and time.  Psychiatric:        Mood and Affect: Mood normal.        Thought Content: Thought content normal.      UC Treatments / Results  Labs (all labs ordered are listed, but only abnormal results are displayed) Labs Reviewed - No data to display  EKG   Radiology No results found.  Procedures Procedures (including critical care time)  Medications Ordered in UC Medications - No data to display  Initial Impression / Assessment and Plan / UC Course  I have reviewed the triage vital signs and the nursing notes.  Pertinent labs & imaging results that were available during my care of the patient were reviewed by me and considered in my medical decision making (see chart for details).     Given duration and worsening course, will treat with prednisone, Augmentin, albuterol for wheezing as needed, Phenergan  DM, supportive over-the-counter medications at home care.  Return for worsening or unresolving symptoms.  Final Clinical Impressions(s) / UC Diagnoses   Final diagnoses:  Lower respiratory infection  Wheezing     Discharge Instructions      In addition to the prescribed medications, you may take plain Mucinex, use saline sinus rinses, humidifiers, Astelin and/or Flonase nasal spray.  Follow-up for worsening or unresolving symptoms.    ED Prescriptions     Medication Sig Dispense Auth. Provider   predniSONE (DELTASONE) 20 MG  tablet Take 2 tablets (40 mg total) by mouth daily with breakfast. 10 tablet Stuart Vernell Norris, PA-C   albuterol (VENTOLIN HFA) 108 (90 Base) MCG/ACT inhaler Inhale 2 puffs into the lungs every 4 (four) hours as needed. 18 g Stuart Vernell Norris, PA-C   amoxicillin-clavulanate (AUGMENTIN) 875-125 MG tablet Take 1 tablet by mouth every 12 (twelve) hours. 14 tablet Stuart Vernell Norris, PA-C   promethazine -dextromethorphan (PROMETHAZINE -DM) 6.25-15 MG/5ML syrup Take 5 mLs by mouth 4 (four) times daily as needed. 100 mL Stuart Vernell Norris, [place]      PDMP not reviewed this encounter.   Stuart Vernell Norris, [place] 02/24/24 0800

## 2025-01-29 ENCOUNTER — Ambulatory Visit: Admitting: Neurology
